# Patient Record
Sex: Female | Born: 1975 | Race: Black or African American | Hispanic: No | Marital: Single | Smoking: Never smoker
Health system: Southern US, Community
[De-identification: ages and names within clinical notes are randomized; demographics above are authoritative.]

## PROBLEM LIST (undated history)

## (undated) DIAGNOSIS — I10 Essential (primary) hypertension: Secondary | ICD-10-CM

## (undated) DIAGNOSIS — E669 Obesity, unspecified: Secondary | ICD-10-CM

## (undated) HISTORY — PX: OTHER SURGICAL HISTORY: SHX169

## (undated) HISTORY — DX: Obesity, unspecified: E66.9

---

## 2003-01-20 ENCOUNTER — Other Ambulatory Visit: Admission: RE | Admit: 2003-01-20 | Discharge: 2003-01-20 | Payer: Self-pay | Admitting: Family Medicine

## 2004-06-14 ENCOUNTER — Other Ambulatory Visit: Admission: RE | Admit: 2004-06-14 | Discharge: 2004-06-14 | Payer: Self-pay | Admitting: Family Medicine

## 2004-12-24 ENCOUNTER — Emergency Department (HOSPITAL_COMMUNITY): Admission: EM | Admit: 2004-12-24 | Discharge: 2004-12-24 | Payer: Self-pay | Admitting: Emergency Medicine

## 2005-08-05 ENCOUNTER — Other Ambulatory Visit: Admission: RE | Admit: 2005-08-05 | Discharge: 2005-08-05 | Payer: Self-pay | Admitting: Family Medicine

## 2006-12-18 ENCOUNTER — Other Ambulatory Visit: Admission: RE | Admit: 2006-12-18 | Discharge: 2006-12-18 | Payer: Self-pay | Admitting: Family Medicine

## 2007-07-04 ENCOUNTER — Encounter: Admission: RE | Admit: 2007-07-04 | Discharge: 2007-07-04 | Payer: Self-pay | Admitting: Family Medicine

## 2008-05-06 ENCOUNTER — Other Ambulatory Visit: Admission: RE | Admit: 2008-05-06 | Discharge: 2008-05-06 | Payer: Self-pay | Admitting: Family Medicine

## 2009-06-29 ENCOUNTER — Other Ambulatory Visit: Admission: RE | Admit: 2009-06-29 | Discharge: 2009-06-29 | Payer: Self-pay | Admitting: Family Medicine

## 2010-08-25 ENCOUNTER — Other Ambulatory Visit (HOSPITAL_COMMUNITY)
Admission: RE | Admit: 2010-08-25 | Discharge: 2010-08-25 | Disposition: A | Discharge status: Home / Self Care | Payer: PRIVATE HEALTH INSURANCE | Source: Ambulatory Visit | Attending: Family Medicine | Admitting: Family Medicine

## 2010-08-25 ENCOUNTER — Other Ambulatory Visit: Payer: Self-pay | Admitting: Physician Assistant

## 2010-08-25 DIAGNOSIS — Z124 Encounter for screening for malignant neoplasm of cervix: Secondary | ICD-10-CM | POA: Insufficient documentation

## 2011-12-14 ENCOUNTER — Other Ambulatory Visit: Payer: Self-pay | Admitting: Physician Assistant

## 2011-12-14 ENCOUNTER — Other Ambulatory Visit (HOSPITAL_COMMUNITY)
Admission: RE | Admit: 2011-12-14 | Discharge: 2011-12-14 | Disposition: A | Discharge status: Home / Self Care | Payer: PRIVATE HEALTH INSURANCE | Source: Ambulatory Visit | Attending: Obstetrics and Gynecology | Admitting: Obstetrics and Gynecology

## 2011-12-14 DIAGNOSIS — Z124 Encounter for screening for malignant neoplasm of cervix: Secondary | ICD-10-CM | POA: Insufficient documentation

## 2011-12-16 ENCOUNTER — Other Ambulatory Visit: Payer: Self-pay | Admitting: Family Medicine

## 2011-12-16 DIAGNOSIS — R52 Pain, unspecified: Secondary | ICD-10-CM

## 2011-12-16 DIAGNOSIS — R609 Edema, unspecified: Secondary | ICD-10-CM

## 2011-12-16 DIAGNOSIS — R531 Weakness: Secondary | ICD-10-CM

## 2011-12-21 ENCOUNTER — Other Ambulatory Visit: Payer: PRIVATE HEALTH INSURANCE

## 2011-12-22 ENCOUNTER — Ambulatory Visit
Admission: RE | Admit: 2011-12-22 | Discharge: 2011-12-22 | Disposition: A | Discharge status: Home / Self Care | Payer: PRIVATE HEALTH INSURANCE | Source: Ambulatory Visit | Attending: Family Medicine | Admitting: Family Medicine

## 2011-12-22 DIAGNOSIS — R52 Pain, unspecified: Secondary | ICD-10-CM

## 2011-12-22 DIAGNOSIS — R531 Weakness: Secondary | ICD-10-CM

## 2011-12-22 DIAGNOSIS — R609 Edema, unspecified: Secondary | ICD-10-CM

## 2012-02-21 ENCOUNTER — Encounter: Payer: PRIVATE HEALTH INSURANCE | Attending: Physician Assistant | Admitting: *Deleted

## 2012-02-21 DIAGNOSIS — E119 Type 2 diabetes mellitus without complications: Secondary | ICD-10-CM | POA: Insufficient documentation

## 2012-02-21 DIAGNOSIS — Z713 Dietary counseling and surveillance: Secondary | ICD-10-CM | POA: Insufficient documentation

## 2012-02-22 ENCOUNTER — Encounter: Payer: Self-pay | Admitting: *Deleted

## 2012-02-22 NOTE — Progress Notes (Signed)
  Patient was seen on 02/21/2012 for the first of a series of three diabetes self-management courses at the [facility].  A1c 01/2012 = 6.5% per patient The following learning objectives were met by the patient during this course:   Defines the role of glucose and insulin  Identifies type of diabetes and pathophysiology  Defines the diagnostic criteria for diabetes and prediabetes  States the risk factors for Type 2 Diabetes  States the symptoms of Type 2 Diabetes  Defines Type 2 Diabetes treatment goals  Defines Type 2 Diabetes treatment options  States the rationale for glucose monitoring  Identifies A1C, glucose targets, and testing times  Identifies proper sharps disposal  Defines the purpose of a diabetes food plan  Identifies carbohydrate food groups  Defines effects of carbohydrate foods on glucose levels  Identifies carbohydrate choices/grams/food labels  States benefits of physical activity and effect on glucose  Review of suggested activity guidelines  Handouts given during class include:  Type 2 Diabetes: Basics Book  My Food Plan Book  Food and Activity Log  Follow-Up Plan: Core Class 2

## 2012-02-22 NOTE — Patient Instructions (Signed)
Goals:  Follow Diabetes Meal Plan as instructed  Eat 3 meals and 2 snacks, every 3-5 hrs  Limit carbohydrate intake to 45-60 grams carbohydrate/meal  Limit carbohydrate intake to 0-30 grams carbohydrate/snack  Add lean protein foods to meals/snacks  Monitor glucose levels as instructed by your doctor  Aim for 5-15 mins of physical activity daily as tolerated  Bring food record and glucose log to your next nutrition visit

## 2012-03-27 ENCOUNTER — Encounter: Payer: PRIVATE HEALTH INSURANCE | Attending: Physician Assistant | Admitting: *Deleted

## 2012-03-27 DIAGNOSIS — Z713 Dietary counseling and surveillance: Secondary | ICD-10-CM | POA: Insufficient documentation

## 2012-03-27 DIAGNOSIS — E119 Type 2 diabetes mellitus without complications: Secondary | ICD-10-CM | POA: Insufficient documentation

## 2012-03-28 NOTE — Progress Notes (Signed)
  Patient was seen on 03/27/12 for the second of a series of three diabetes self-management courses at the [facility]. The following learning objectives were met by the patient during this course:   Explain basic nutrition maintenance and quality assurance  Describe causes, symptoms and treatment of hypoglycemia and hyperglycemia  Explain how to manage diabetes during illness  Describe the importance of good nutrition for health and healthy eating strategies  List strategies to follow meal plan when dining out  Describe the effects of alcohol on glucose and how to use it safely  Describe problem solving skills for day-to-day glucose challenges  Describe strategies to use when treatment plan needs to change  Identify important factors involved in successful weight loss  Describe ways to remain physically active  Describe the impact of regular activity on insulin resistance    Handouts given in class:  Refrigerator magnet for Sick Day Guidelines  [facility] Oral medication/insulin handout  Follow-Up Plan: Patient will attend the final class of the ADA Diabetes Self-Care Education.   

## 2012-03-28 NOTE — Patient Instructions (Signed)
Goals:  Follow Diabetes Meal Plan as instructed  Eat 3 meals and 2 snacks, every 3-5 hrs  Limit carbohydrate intake to 30-45 grams carbohydrate/meal  Limit carbohydrate intake to 15 grams carbohydrate/snack  Add lean protein foods to meals/snacks  Monitor glucose levels as instructed by your doctor  Aim for 30 mins of physical activity daily  Bring food record and glucose log to your next nutrition visit 

## 2012-05-08 ENCOUNTER — Encounter: Payer: PRIVATE HEALTH INSURANCE | Attending: Physician Assistant | Admitting: Dietician

## 2012-05-08 DIAGNOSIS — E119 Type 2 diabetes mellitus without complications: Secondary | ICD-10-CM

## 2012-05-08 DIAGNOSIS — Z713 Dietary counseling and surveillance: Secondary | ICD-10-CM | POA: Insufficient documentation

## 2012-05-09 NOTE — Progress Notes (Signed)
  Patient was seen on 05/08/2012 for the third of a series of three diabetes self-management courses at the [facility]. The following learning objectives were met by the patient during this course:    Describe how diabetes changes over time   Identify diabetes complications and ways to prevent them   Describe strategies that can promote heart health including lowering blood pressure and cholesterol   Describe strategies to lower dietary fat and sodium in the diet   Identify physical activities that benefit cardiovascular health   Evaluate success in meeting personal goal   Describe the belief that they can live successfully with diabetes day to day   Establish 2-3 goals that they will plan to diligently work on until they return for the free 72-month follow-up visit  The following handouts were given in class:  3 Month Follow Up Visit handout  Goal setting handout  Class evaluation form  Your patient has established the following 3 month goals for diabetes self-care:  Count Carbohydrates for most of my meals and snacks.  Be active 30 minutes or more 3 times per week.  Take my BP medication.  Initial Vitals:  HT: 5'5''  WT: 345.3 lb  A1C: 6.5% (01/2012)  Follow-Up Plan: Patient will attend a 3 month follow-up visit for diabetes self-management education.

## 2012-08-09 ENCOUNTER — Ambulatory Visit: Payer: PRIVATE HEALTH INSURANCE | Admitting: *Deleted

## 2012-12-18 ENCOUNTER — Other Ambulatory Visit: Payer: Self-pay | Admitting: Physician Assistant

## 2012-12-18 ENCOUNTER — Other Ambulatory Visit (HOSPITAL_COMMUNITY)
Admission: RE | Admit: 2012-12-18 | Discharge: 2012-12-18 | Disposition: A | Discharge status: Home / Self Care | Payer: PRIVATE HEALTH INSURANCE | Source: Ambulatory Visit | Attending: Family Medicine | Admitting: Family Medicine

## 2012-12-18 DIAGNOSIS — Z124 Encounter for screening for malignant neoplasm of cervix: Secondary | ICD-10-CM | POA: Insufficient documentation

## 2017-05-18 ENCOUNTER — Emergency Department (HOSPITAL_COMMUNITY): Payer: 59

## 2017-05-18 ENCOUNTER — Encounter (HOSPITAL_COMMUNITY): Payer: Self-pay | Admitting: Emergency Medicine

## 2017-05-18 ENCOUNTER — Other Ambulatory Visit: Payer: Self-pay

## 2017-05-18 ENCOUNTER — Inpatient Hospital Stay (HOSPITAL_COMMUNITY)
Admission: EM | Admit: 2017-05-18 | Discharge: 2017-05-20 | DRG: 418 | Disposition: A | Discharge status: Home / Self Care | Payer: 59 | Attending: Surgery | Admitting: Surgery

## 2017-05-18 DIAGNOSIS — K819 Cholecystitis, unspecified: Secondary | ICD-10-CM | POA: Diagnosis present

## 2017-05-18 DIAGNOSIS — K8000 Calculus of gallbladder with acute cholecystitis without obstruction: Secondary | ICD-10-CM | POA: Diagnosis not present

## 2017-05-18 DIAGNOSIS — I10 Essential (primary) hypertension: Secondary | ICD-10-CM | POA: Diagnosis present

## 2017-05-18 DIAGNOSIS — Z79899 Other long term (current) drug therapy: Secondary | ICD-10-CM

## 2017-05-18 DIAGNOSIS — K219 Gastro-esophageal reflux disease without esophagitis: Secondary | ICD-10-CM | POA: Diagnosis present

## 2017-05-18 DIAGNOSIS — Z6841 Body Mass Index (BMI) 40.0 and over, adult: Secondary | ICD-10-CM | POA: Diagnosis not present

## 2017-05-18 DIAGNOSIS — Z419 Encounter for procedure for purposes other than remedying health state, unspecified: Secondary | ICD-10-CM

## 2017-05-18 DIAGNOSIS — K8020 Calculus of gallbladder without cholecystitis without obstruction: Secondary | ICD-10-CM

## 2017-05-18 DIAGNOSIS — R1011 Right upper quadrant pain: Secondary | ICD-10-CM | POA: Diagnosis not present

## 2017-05-18 HISTORY — DX: Essential (primary) hypertension: I10

## 2017-05-18 LAB — I-STAT BETA HCG BLOOD, ED (MC, WL, AP ONLY): I-stat hCG, quantitative: <5 m[IU]/mL (ref <5)

## 2017-05-18 LAB — COMPREHENSIVE METABOLIC PANEL
ALT: 12 U/L — ABNORMAL LOW (ref 14–54)
AST: 17 U/L (ref 15–41)
Albumin: 3.9 g/dL (ref 3.5–5.0)
Alkaline Phosphatase: 63 U/L (ref 38–126)
Anion gap: 12 (ref 5–15)
BUN: 15 mg/dL (ref 6–20)
CO2: 25 mmol/L (ref 22–32)
Calcium: 9.3 mg/dL (ref 8.9–10.3)
Chloride: 101 mmol/L (ref 101–111)
Creatinine, Ser: 1.26 mg/dL — ABNORMAL HIGH (ref 0.44–1.00)
GFR calc Af Amer: >60 mL/min (ref >60)
GFR calc non Af Amer: 52 mL/min — ABNORMAL LOW (ref >60)
Glucose, Bld: 112 mg/dL — ABNORMAL HIGH (ref 65–99)
Potassium: 3.2 mmol/L — ABNORMAL LOW (ref 3.5–5.1)
Sodium: 138 mmol/L (ref 135–145)
Total Bilirubin: 0.5 mg/dL (ref 0.3–1.2)
Total Protein: 8.5 g/dL — ABNORMAL HIGH (ref 6.5–8.1)

## 2017-05-18 LAB — CBC
HCT: 40.4 % (ref 36.0–46.0)
Hemoglobin: 13.1 g/dL (ref 12.0–15.0)
MCH: 27.4 pg (ref 26.0–34.0)
MCHC: 32.4 g/dL (ref 30.0–36.0)
MCV: 84.5 fL (ref 78.0–100.0)
Platelets: 433 10*3/uL — ABNORMAL HIGH (ref 150–400)
RBC: 4.78 MIL/uL (ref 3.87–5.11)
RDW: 15.8 % — ABNORMAL HIGH (ref 11.5–15.5)
WBC: 11.8 10*3/uL — ABNORMAL HIGH (ref 4.0–10.5)

## 2017-05-18 LAB — LIPASE, BLOOD: Lipase: 28 U/L (ref 11–51)

## 2017-05-18 MED ORDER — POTASSIUM CHLORIDE 10 MEQ/100ML IV SOLN
10.0000 meq | INTRAVENOUS | Status: AC
  Administered 2017-05-18 – 2017-05-19 (×4): 10 meq via INTRAVENOUS
  Filled 2017-05-18 (×4): qty 100

## 2017-05-18 MED ORDER — SODIUM CHLORIDE 0.9 % IV SOLN
2.0000 g | INTRAVENOUS | Status: DC
  Administered 2017-05-19: 2 g via INTRAVENOUS
  Filled 2017-05-18: qty 20

## 2017-05-18 MED ORDER — SODIUM CHLORIDE 0.9 % IV BOLUS (SEPSIS)
1000.0000 mL | Freq: Once | INTRAVENOUS | Status: AC
  Administered 2017-05-18: 1000 mL via INTRAVENOUS

## 2017-05-18 MED ORDER — MORPHINE SULFATE (PF) 4 MG/ML IV SOLN
2.0000 mg | INTRAVENOUS | Status: DC | PRN
  Administered 2017-05-19 (×2): 2 mg via INTRAVENOUS
  Filled 2017-05-18 (×2): qty 1

## 2017-05-18 MED ORDER — CEFTRIAXONE SODIUM 2 G IJ SOLR
2.0000 g | Freq: Once | INTRAMUSCULAR | Status: AC
  Administered 2017-05-18: 2 g via INTRAVENOUS
  Filled 2017-05-18 (×2): qty 20

## 2017-05-18 MED ORDER — HYDRALAZINE HCL 20 MG/ML IJ SOLN: 20.0000 mg | INTRAMUSCULAR | Status: DC | PRN

## 2017-05-18 MED ORDER — ENOXAPARIN SODIUM 40 MG/0.4ML ~~LOC~~ SOLN
40.0000 mg | SUBCUTANEOUS | Status: DC
  Administered 2017-05-19: 40 mg via SUBCUTANEOUS
  Filled 2017-05-18: qty 0.4

## 2017-05-18 MED ORDER — ONDANSETRON 4 MG PO TBDP
4.0000 mg | ORAL_TABLET | Freq: Four times a day (QID) | ORAL | Status: DC | PRN
  Filled 2017-05-18: qty 1

## 2017-05-18 MED ORDER — ACETAMINOPHEN 650 MG RE SUPP: 650.0000 mg | Freq: Four times a day (QID) | RECTAL | Status: DC | PRN

## 2017-05-18 MED ORDER — ONDANSETRON HCL 4 MG/2ML IJ SOLN
4.0000 mg | Freq: Four times a day (QID) | INTRAMUSCULAR | Status: DC | PRN
  Administered 2017-05-19 – 2017-05-20 (×2): 4 mg via INTRAVENOUS
  Filled 2017-05-18 (×2): qty 2

## 2017-05-18 MED ORDER — PANTOPRAZOLE SODIUM 40 MG PO TBEC
40.0000 mg | DELAYED_RELEASE_TABLET | Freq: Every day | ORAL | Status: DC
  Administered 2017-05-18 – 2017-05-20 (×2): 40 mg via ORAL
  Filled 2017-05-18 (×2): qty 1

## 2017-05-18 MED ORDER — SIMETHICONE 80 MG PO CHEW
40.0000 mg | CHEWABLE_TABLET | Freq: Four times a day (QID) | ORAL | Status: DC | PRN
  Administered 2017-05-19 – 2017-05-20 (×2): 40 mg via ORAL
  Filled 2017-05-18 (×2): qty 1

## 2017-05-18 MED ORDER — SODIUM CHLORIDE 0.9 % IV SOLN
INTRAVENOUS | Status: DC
  Administered 2017-05-18: 1000 mL via INTRAVENOUS
  Administered 2017-05-19: 18:00:00 via INTRAVENOUS

## 2017-05-18 MED ORDER — ACETAMINOPHEN 325 MG PO TABS
650.0000 mg | ORAL_TABLET | Freq: Four times a day (QID) | ORAL | Status: DC | PRN
  Administered 2017-05-19: 650 mg via ORAL
  Filled 2017-05-18: qty 2

## 2017-05-18 NOTE — ED Notes (Signed)
Called for pt diet tray  

## 2017-05-18 NOTE — H&P (Signed)
Admission Note   Chief Complaint/Reason for Consult: Abdominal Pain (probable acute cholecystitis)  HPI:  Kendra Hutchinson is a 42 yo female presenting to [facility] complaining of RUQ abdominal pain. Pain started 4 days ago with acute onset sharp RUQ abdominal pain with associated nausea and vomiting. She reports that she had ~10 episodes on Monday and a few on Tuesday am. Pain is aggravated by eating, drinking, laying flat, and any movement - she is most comfortable laying with head elevated. She tried pepto, but could not keep it down. Naproxen provided minimal pain relief. Subjective chills on Monday, but did not take her temperature at home. Currently, her pain is 6/10. She denies any CP or SOB. Today, she went to an urgent care, who recommended she visit the ED for evaluation of her gallbladder.   PMH: Hypertension, GERD, obesity Surgical History: none Home medications: lisinopril, omeprazole, OTC allergy medication  SH: lives at home with her mother, father, and 2 nephews; She works as a [employer] manager, and has missed work x4 days. She is non-smoker and drinks alcohol rarely. Does not exercise. Allergies: NKDA  ROS: Review of Systems  Constitutional: Positive for chills. Negative for malaise/fatigue.  Respiratory: Negative for cough, shortness of breath and wheezing.        Cannot take deep breaths due to pain  Cardiovascular: Negative for chest pain, palpitations and leg swelling.  Gastrointestinal: Positive for abdominal pain, diarrhea, nausea and vomiting. Negative for heartburn.  Genitourinary: Negative for dysuria, frequency and urgency.       Decreased output  Skin: Negative.    No family history on file.  Vitals:   05/18/17 1641 05/18/17 1822  BP: 138/84   Pulse: 79   Resp: 20   Temp:  99.2 F (37.3 C)  SpO2: 97%    Recent Results (from the past 2160 hour(s))  Lipase, blood     Status: None   Collection Time: 05/18/17  2:17 PM  Result Value Ref Range   Lipase 28 11 - 51 U/L    Comment: Performed at [facility] [address], [place], [place] [address]  Comprehensive metabolic panel     Status: Abnormal   Collection Time: 05/18/17  2:17 PM  Result Value Ref Range   Sodium 138 135 - 145 mmol/L   Potassium 3.2 (L) 3.5 - 5.1 mmol/L   Chloride 101 101 - 111 mmol/L   CO2 25 22 - 32 mmol/L   Glucose, Bld 112 (H) 65 - 99 mg/dL   BUN 15 6 - 20 mg/dL   Creatinine, Ser 1.26 (H) 0.44 - 1.00 mg/dL   Calcium 9.3 8.9 - 10.3 mg/dL   Total Protein 8.5 (H) 6.5 - 8.1 g/dL   Albumin 3.9 3.5 - 5.0 g/dL   AST 17 15 - 41 U/L   ALT 12 (L) 14 - 54 U/L   Alkaline Phosphatase 63 38 - 126 U/L   Total Bilirubin 0.5 0.3 - 1.2 mg/dL   GFR calc non Af Amer 52 (L) >60 mL/min   GFR calc Af Amer >60 >60 mL/min    Comment: (NOTE) The eGFR has been calculated using the CKD EPI equation. This calculation has not been validated in all clinical situations. eGFR's persistently <60 mL/min signify possible Chronic Kidney Disease.    Anion gap 12 5 - 15    Comment: Performed at [facility] [address], [place], [place] [address]  CBC     Status: Abnormal  Collection Time: 05/18/17  2:17 PM  Result Value Ref Range   WBC 11.8 (H) 4.0 - 10.5 K/uL   RBC 4.78 3.87 - 5.11 MIL/uL   Hemoglobin 13.1 12.0 - 15.0 g/dL   HCT 40.4 36.0 - 46.0 %   MCV 84.5 78.0 - 100.0 fL   MCH 27.4 26.0 - 34.0 pg   MCHC 32.4 30.0 - 36.0 g/dL   RDW 15.8 (H) 11.5 - 15.5 %   Platelets 433 (H) 150 - 400 K/uL    Comment: Performed at [facility] [address], [place], [place] [address]  I-Stat beta hCG blood, ED     Status: None   Collection Time: 05/18/17  2:58 PM  Result Value Ref Range   I-stat hCG, quantitative <5.0 <5 mIU/mL   Comment 3            Comment:   GEST. AGE      CONC.  (mIU/mL)   <=1 WEEK        5 - 50     2 WEEKS       50 - 500     3 WEEKS       100 - 10,000     4 WEEKS     1,000 - 30,000        FEMALE AND NON-PREGNANT FEMALE:     LESS THAN 5  mIU/mL     Physical Exam: Physical Exam  Constitutional: She is well-developed, well-nourished, and in no distress. No distress.  HENT:  Head: Normocephalic and atraumatic.  Nose: Nose normal.  Mouth/Throat: Oropharynx is clear and moist.  Eyes: Conjunctivae are normal. Pupils are equal, round, and reactive to light. No scleral icterus.  Neck: Neck supple.  Cardiovascular: Normal rate, regular rhythm and normal heart sounds. Exam reveals no gallop and no friction rub.  No murmur heard. Pulmonary/Chest: Effort normal and breath sounds normal. No respiratory distress. She has no wheezes.  Abdominal: Soft. Bowel sounds are normal. She exhibits no distension and no mass. There is tenderness.  TTP in RUQ; positive Murphy's Sign  Lymphadenopathy:    She has no cervical adenopathy.  Skin: Skin is warm and dry. No rash noted. No erythema. No pallor.  Psychiatric: Affect normal.   Assessment/Plan Acute Cholecystitis   Plan - Admit to general surgery service. Continue IVF and control pain. Begin antibiotics.  Plan for laparoscopic cholecystectomy tomorrow.  Jenny Reichmann, PA-S [facility] Surgery 05/18/2017, 7:03 PM  She has cholecystitis on exam, will repeat US here as I do not have it.  Has murphys on exam.  Will admit, abx, npo after mn, lap chole in am. I discussed the procedure in detail. We discussed the risks and benefits of a laparoscopic cholecystectomy and possible cholangiogram including, but not limited to bleeding, infection, injury to surrounding structures such as the intestine or liver, bile leak, retained gallstones, need to convert to an open procedure, prolonged diarrhea, blood clots such as  DVT, common bile duct injury, anesthesia risks, and possible need for additional procedures.  The likelihood of improvement in symptoms and return to the patient's normal status is good. We discussed the typical post-operative recovery course.

## 2017-05-18 NOTE — ED Notes (Signed)
[facility]

## 2017-05-18 NOTE — ED Provider Notes (Signed)
Patient placed in Quick Look pathway, seen and evaluated   Chief Complaint: Right upper quadrant pain  HPI:   Ultrasound at [facility] visible on care everywhere.  Pt has right upper quadrant abdominal pain   ROS: Review of Systems  Gastrointestinal: Positive for abdominal pain, nausea and vomiting.  All other systems reviewed and are negative.   Physical Exam:   Gen: No distress  Neuro: Awake and Alert  Skin: Warm    Focused Exam: chest rrr Abdomen tender right upper quadrant   Initiation of care has begun. The patient has been counseled on the process, plan, and necessity for staying for the completion/evaluation, and the remainder of the medical screening examination   Kendra CheeksSofia, Kendra Korte K, PA-C 05/18/17 1429    Arby BarrettePfeiffer, Marcy, MD 05/19/17 [id number]

## 2017-05-18 NOTE — ED Notes (Signed)
Dr. Wakefield at bedside. 

## 2017-05-18 NOTE — ED Triage Notes (Signed)
Sent here from imaging center after gallstone visualized by ultrasound. Urgent care called patient and told her that she had an "acute gallstone blockage" and needed to be seen at the ED.

## 2017-05-18 NOTE — ED Provider Notes (Signed)
[facility] EMERGENCY DEPARTMENT Provider Note   CSN: [id number] Arrival date & time: 05/18/17  1346     History   Chief Complaint No chief complaint on file.   HPI Kendra Hutchinson is a 42 y.o. female.  HPI   42 year old female presents today with complaints of right upper quadrant abdominal pain.  Patient notes the symptoms started 4 days ago with acute onset of right upper quadrant pain, she has nausea and vomiting that has been ongoing since initial presentation.  She notes the pain was more severe on Monday but continues to persist.  Patient reports that she felt hot on Monday but did not measure temperature at home.  She notes that attempts at eating and drinking have caused worsening symptoms.  She notes that while at rest she has minimal pain, any movement causes more severe pain.  Patient was unable to tolerate any food yesterday, she was able to tolerate water today and a small amount of protein shake which significantly worsened her pain.  Patient denies any abdominal surgeries, she reports a history of hypertension but has been unable to take her blood pressure medication.  Patient notes she did have a small bowel movement this morning.    Past Medical History:  Diagnosis Date  . Hypertension   . Obesity     There are no active problems to display for this patient.   Past Surgical History:  Procedure Laterality Date  . tongue clipped       OB History    No data available       Home Medications    Prior to Admission medications   Medication Sig Start Date End Date Taking? Authorizing Provider  cyclobenzaprine (FLEXERIL) 10 MG tablet Take 10 mg by mouth 3 (three) times daily as needed.    [provider]  meloxicam (MOBIC) 7.5 MG tablet Take 7.5 mg by mouth daily as needed.    [provider]  omeprazole (PRILOSEC) 20 MG capsule Take 20 mg by mouth.    [provider]    Family History No family history on  file.  Social History Social History   Tobacco Use  . Smoking status: Never Smoker  . Smokeless tobacco: Never Used  Substance Use Topics  . Alcohol use: No    Frequency: Never  . Drug use: No     Allergies   Patient has no known allergies.   Review of Systems Review of Systems  All other systems reviewed and are negative.  Physical Exam Updated Vital Signs BP 138/84 (BP Location: Right Arm)   Pulse 79   Temp 99.2 F (37.3 C) (Oral)   Resp 20   Ht 5\' 5"  (1.651 m)   Wt (!) 150.6 kg (332 lb)   LMP 05/04/2017 (Exact Date)   SpO2 97%   BMI 55.25 kg/m   Physical Exam  Constitutional: She is oriented to person, place, and time. She appears well-developed and well-nourished.  HENT:  Head: Normocephalic and atraumatic.  Eyes: Conjunctivae are normal. Pupils are equal, round, and reactive to light. Right eye exhibits no discharge. Left eye exhibits no discharge. No scleral icterus.  Neck: Normal range of motion. No JVD present. No tracheal deviation present.  Pulmonary/Chest: Effort normal. No stridor.  Abdominal:  Tenderness palpation of right upper quadrant positive Murphy sign minor tenderness palpation of right lower quadrant, remainder of exam without acute abnormality  Neurological: She is alert and oriented to person, place, and time. Coordination  normal.  Psychiatric: She has a normal mood and affect. Her behavior is normal. Judgment and thought content normal.  Nursing note and vitals reviewed.   ED Treatments / Results  Labs (all labs ordered are listed, but only abnormal results are displayed) Labs Reviewed  COMPREHENSIVE METABOLIC PANEL - Abnormal; Notable for the following components:      Result Value   Potassium 3.2 (*)    Glucose, Bld 112 (*)    Creatinine, Ser 1.26 (*)    Total Protein 8.5 (*)    ALT 12 (*)    GFR calc non Af Amer 52 (*)    All other components within normal limits  CBC - Abnormal; Notable for the following components:   WBC  11.8 (*)    RDW 15.8 (*)    Platelets 433 (*)    All other components within normal limits  LIPASE, BLOOD  URINALYSIS, ROUTINE W REFLEX MICROSCOPIC  I-STAT BETA HCG BLOOD, ED ([facility], [facility], [facility] ONLY)    EKG  EKG Interpretation None       Radiology No results found.  Procedures Procedures (including critical care time)  Medications Ordered in ED Medications  cefTRIAXone (ROCEPHIN) 2 g in sodium chloride 0.9 % 100 mL IVPB (not administered)  sodium chloride 0.9 % bolus 1,000 mL (1,000 mLs Intravenous New Bag/Given 05/18/17 1833)     Initial Impression / Assessment and Plan / ED Course  I have reviewed the triage vital signs and the nursing notes.  Pertinent labs & imaging results that were available during my care of the patient were reviewed by me and considered in my medical decision making (see chart for details).       Final Clinical Impressions(s) / ED Diagnoses   Final diagnoses:  Calculus of gallbladder without cholecystitis without obstruction    Labs: CBC, CMP, lipase  Imaging:  Consults:  Therapeutics: Ceftriaxone, normal saline  Discharge Meds:   Assessment/Plan: 42 year old female presents today with likely symptomatic cholelithiasis with questionable early cholecystitis.  She is afebrile here with minor elevation in white count at 11.8.  No obstructive pattern on LFTs.  General surgery consulted who will see patient at bedside for likely hospital admission and ongoing management.  Patient offered pain medicine and antinausea medication she refused while here in the ED.    ED Discharge Orders    None       Rosalio LoudHedges, Kyrillos Adams, PA-C 05/18/17 1907    Vanetta MuldersZackowski, Scott, MD 05/19/17 [phone]

## 2017-05-18 NOTE — ED Notes (Signed)
Pt made aware she needs to provide a UA. States she has been unable to provide thus far, but verbalized understanding.

## 2017-05-19 ENCOUNTER — Inpatient Hospital Stay (HOSPITAL_COMMUNITY): Payer: 59

## 2017-05-19 ENCOUNTER — Encounter (HOSPITAL_COMMUNITY): Payer: Self-pay | Admitting: Certified Registered Nurse Anesthetist

## 2017-05-19 ENCOUNTER — Inpatient Hospital Stay (HOSPITAL_COMMUNITY): Payer: 59 | Admitting: Certified Registered Nurse Anesthetist

## 2017-05-19 ENCOUNTER — Other Ambulatory Visit: Payer: Self-pay

## 2017-05-19 ENCOUNTER — Encounter (HOSPITAL_COMMUNITY): Admission: EM | Disposition: A | Discharge status: Home / Self Care | Payer: Self-pay | Source: Home / Self Care

## 2017-05-19 HISTORY — PX: CHOLECYSTECTOMY: SHX55

## 2017-05-19 LAB — COMPREHENSIVE METABOLIC PANEL
ALT: 9 U/L — ABNORMAL LOW (ref 14–54)
AST: 14 U/L — AB (ref 15–41)
Albumin: 2.8 g/dL — ABNORMAL LOW (ref 3.5–5.0)
Alkaline Phosphatase: 54 U/L (ref 38–126)
Anion gap: 9 (ref 5–15)
BUN: 10 mg/dL (ref 6–20)
CO2: 24 mmol/L (ref 22–32)
Calcium: 8.2 mg/dL — ABNORMAL LOW (ref 8.9–10.3)
Chloride: 106 mmol/L (ref 101–111)
Creatinine, Ser: 0.89 mg/dL (ref 0.44–1.00)
GFR calc Af Amer: >60 mL/min (ref >60)
GFR calc non Af Amer: >60 mL/min (ref >60)
Glucose, Bld: 91 mg/dL (ref 65–99)
POTASSIUM: 3.3 mmol/L — AB (ref 3.5–5.1)
Sodium: 139 mmol/L (ref 135–145)
Total Bilirubin: 0.7 mg/dL (ref 0.3–1.2)
Total Protein: 6.1 g/dL — ABNORMAL LOW (ref 6.5–8.1)

## 2017-05-19 LAB — URINALYSIS, ROUTINE W REFLEX MICROSCOPIC
Bilirubin Urine: NEGATIVE
Glucose, UA: NEGATIVE mg/dL
Ketones, ur: NEGATIVE mg/dL
Leukocytes, UA: NEGATIVE
Nitrite: NEGATIVE
Protein, ur: NEGATIVE mg/dL
Specific Gravity, Urine: 1.005 (ref 1.005–1.030)
pH: 6 (ref 5.0–8.0)

## 2017-05-19 LAB — SURGICAL PCR SCREEN
MRSA, PCR: NEGATIVE
Staphylococcus aureus: NEGATIVE

## 2017-05-19 SURGERY — LAPAROSCOPIC CHOLECYSTECTOMY WITH INTRAOPERATIVE CHOLANGIOGRAM
Anesthesia: General | Site: Abdomen

## 2017-05-19 MED ORDER — KETOROLAC TROMETHAMINE 30 MG/ML IJ SOLN: 30.0000 mg | Freq: Once | INTRAMUSCULAR | Status: DC | PRN

## 2017-05-19 MED ORDER — ACETAMINOPHEN 325 MG PO TABS: 325.0000 mg | ORAL_TABLET | ORAL | Status: DC | PRN

## 2017-05-19 MED ORDER — ACETAMINOPHEN 160 MG/5ML PO SOLN: 325.0000 mg | ORAL | Status: DC | PRN

## 2017-05-19 MED ORDER — SUGAMMADEX SODIUM 500 MG/5ML IV SOLN
INTRAVENOUS | Status: DC | PRN
  Administered 2017-05-19: 300 mg via INTRAVENOUS

## 2017-05-19 MED ORDER — MEPERIDINE HCL 50 MG/ML IJ SOLN: 6.2500 mg | INTRAMUSCULAR | Status: DC | PRN

## 2017-05-19 MED ORDER — FENTANYL CITRATE (PF) 100 MCG/2ML IJ SOLN
25.0000 ug | INTRAMUSCULAR | Status: DC | PRN
  Administered 2017-05-19 (×2): 50 ug via INTRAVENOUS

## 2017-05-19 MED ORDER — BUPIVACAINE-EPINEPHRINE (PF) 0.25% -1:200000 IJ SOLN
INTRAMUSCULAR | Status: AC
  Filled 2017-05-19: qty 30

## 2017-05-19 MED ORDER — FENTANYL CITRATE (PF) 100 MCG/2ML IJ SOLN
INTRAMUSCULAR | Status: DC | PRN
  Administered 2017-05-19 (×5): 50 ug via INTRAVENOUS

## 2017-05-19 MED ORDER — OXYCODONE HCL 5 MG PO TABS: 5.0000 mg | ORAL_TABLET | Freq: Once | ORAL | Status: DC | PRN

## 2017-05-19 MED ORDER — MIDAZOLAM HCL 5 MG/5ML IJ SOLN
INTRAMUSCULAR | Status: DC | PRN
  Administered 2017-05-19: 2 mg via INTRAVENOUS

## 2017-05-19 MED ORDER — 0.9 % SODIUM CHLORIDE (POUR BTL) OPTIME
TOPICAL | Status: DC | PRN
  Administered 2017-05-19: 1000 mL

## 2017-05-19 MED ORDER — ONDANSETRON HCL 4 MG/2ML IJ SOLN
INTRAMUSCULAR | Status: AC
  Filled 2017-05-19: qty 2

## 2017-05-19 MED ORDER — ONDANSETRON HCL 4 MG/2ML IJ SOLN
INTRAMUSCULAR | Status: DC | PRN
  Administered 2017-05-19: 4 mg via INTRAVENOUS

## 2017-05-19 MED ORDER — DEXAMETHASONE SODIUM PHOSPHATE 10 MG/ML IJ SOLN
INTRAMUSCULAR | Status: AC
  Filled 2017-05-19: qty 1

## 2017-05-19 MED ORDER — ROCURONIUM BROMIDE 10 MG/ML (PF) SYRINGE
PREFILLED_SYRINGE | INTRAVENOUS | Status: AC
  Filled 2017-05-19: qty 5

## 2017-05-19 MED ORDER — OXYCODONE HCL 5 MG PO TABS
5.0000 mg | ORAL_TABLET | Freq: Four times a day (QID) | ORAL | Status: DC | PRN
  Administered 2017-05-19: 5 mg via ORAL
  Filled 2017-05-19: qty 1

## 2017-05-19 MED ORDER — HEMOSTATIC AGENTS (NO CHARGE) OPTIME
TOPICAL | Status: DC | PRN
  Administered 2017-05-19: 1 via TOPICAL

## 2017-05-19 MED ORDER — LACTATED RINGERS IV SOLN
INTRAVENOUS | Status: DC
  Administered 2017-05-19: 08:00:00 via INTRAVENOUS

## 2017-05-19 MED ORDER — LIDOCAINE HCL (CARDIAC) 20 MG/ML IV SOLN
INTRAVENOUS | Status: AC
  Filled 2017-05-19: qty 5

## 2017-05-19 MED ORDER — OXYCODONE HCL 5 MG/5ML PO SOLN: 5.0000 mg | Freq: Once | ORAL | Status: DC | PRN

## 2017-05-19 MED ORDER — IOPAMIDOL (ISOVUE-300) INJECTION 61%
INTRAVENOUS | Status: AC
  Filled 2017-05-19: qty 50

## 2017-05-19 MED ORDER — MIDAZOLAM HCL 2 MG/2ML IJ SOLN
INTRAMUSCULAR | Status: AC
  Filled 2017-05-19: qty 2

## 2017-05-19 MED ORDER — PROPOFOL 10 MG/ML IV BOLUS
INTRAVENOUS | Status: AC
  Filled 2017-05-19: qty 40

## 2017-05-19 MED ORDER — SODIUM CHLORIDE 0.9 % IR SOLN
Status: DC | PRN
  Administered 2017-05-19: 1000 mL

## 2017-05-19 MED ORDER — PROPOFOL 10 MG/ML IV BOLUS
INTRAVENOUS | Status: DC | PRN
  Administered 2017-05-19: 200 mg via INTRAVENOUS

## 2017-05-19 MED ORDER — IOPAMIDOL (ISOVUE-300) INJECTION 61%
INTRAVENOUS | Status: DC | PRN
  Administered 2017-05-19: 10 mL

## 2017-05-19 MED ORDER — SUCCINYLCHOLINE CHLORIDE 20 MG/ML IJ SOLN
INTRAMUSCULAR | Status: AC
  Filled 2017-05-19: qty 2

## 2017-05-19 MED ORDER — LIDOCAINE HCL (CARDIAC) 20 MG/ML IV SOLN
INTRAVENOUS | Status: DC | PRN
  Administered 2017-05-19: 100 mg via INTRAVENOUS

## 2017-05-19 MED ORDER — FENTANYL CITRATE (PF) 100 MCG/2ML IJ SOLN
INTRAMUSCULAR | Status: AC
  Filled 2017-05-19: qty 2

## 2017-05-19 MED ORDER — DEXAMETHASONE SODIUM PHOSPHATE 10 MG/ML IJ SOLN
INTRAMUSCULAR | Status: DC | PRN
  Administered 2017-05-19: 10 mg via INTRAVENOUS

## 2017-05-19 MED ORDER — BUPIVACAINE-EPINEPHRINE 0.25% -1:200000 IJ SOLN
INTRAMUSCULAR | Status: DC | PRN
  Administered 2017-05-19: 11 mL

## 2017-05-19 MED ORDER — FENTANYL CITRATE (PF) 250 MCG/5ML IJ SOLN
INTRAMUSCULAR | Status: AC
  Filled 2017-05-19: qty 5

## 2017-05-19 MED ORDER — ROCURONIUM BROMIDE 100 MG/10ML IV SOLN
INTRAVENOUS | Status: DC | PRN
  Administered 2017-05-19: 10 mg via INTRAVENOUS
  Administered 2017-05-19: 5 mg via INTRAVENOUS
  Administered 2017-05-19: 50 mg via INTRAVENOUS

## 2017-05-19 MED ORDER — FENTANYL CITRATE (PF) 100 MCG/2ML IJ SOLN: 25.0000 ug | INTRAMUSCULAR | Status: DC | PRN

## 2017-05-19 MED ORDER — STERILE WATER FOR IRRIGATION IR SOLN
Status: DC | PRN
  Administered 2017-05-19: 1000 mL

## 2017-05-19 MED ORDER — CEFAZOLIN SODIUM 1 G IJ SOLR
INTRAMUSCULAR | Status: AC
  Filled 2017-05-19: qty 40

## 2017-05-19 MED ORDER — ONDANSETRON HCL 4 MG/2ML IJ SOLN: 4.0000 mg | Freq: Once | INTRAMUSCULAR | Status: DC | PRN

## 2017-05-19 SURGICAL SUPPLY — 50 items
APPLIER CLIP ROT 10 11.4 M/L (STAPLE) ×2
BENZOIN TINCTURE PRP APPL 2/3 (GAUZE/BANDAGES/DRESSINGS) ×2 IMPLANT
CANISTER SUCT 3000ML PPV (MISCELLANEOUS) ×2 IMPLANT
CHLORAPREP W/TINT 26ML (MISCELLANEOUS) ×2 IMPLANT
CLIP APPLIE ROT 10 11.4 M/L (STAPLE) ×1 IMPLANT
COVER MAYO STAND STRL (DRAPES) ×2 IMPLANT
COVER SURGICAL LIGHT HANDLE (MISCELLANEOUS) ×2 IMPLANT
DRAPE C-ARM 42X72 X-RAY (DRAPES) ×2 IMPLANT
DRSG TEGADERM 2-3/8X2-3/4 SM (GAUZE/BANDAGES/DRESSINGS) ×6 IMPLANT
DRSG TEGADERM 4X4.75 (GAUZE/BANDAGES/DRESSINGS) ×2 IMPLANT
ELECT REM PT RETURN 9FT ADLT (ELECTROSURGICAL) ×2
ELECTRODE REM PT RTRN 9FT ADLT (ELECTROSURGICAL) ×1 IMPLANT
FILTER SMOKE EVAC LAPAROSHD (FILTER) ×2 IMPLANT
GAUZE SPONGE 2X2 8PLY STRL LF (GAUZE/BANDAGES/DRESSINGS) ×1 IMPLANT
GLOVE BIO SURGEON STRL SZ7 (GLOVE) ×2 IMPLANT
GLOVE BIO SURGEON STRL SZ7.5 (GLOVE) ×2 IMPLANT
GLOVE BIOGEL PI IND STRL 6 (GLOVE) ×2 IMPLANT
GLOVE BIOGEL PI IND STRL 7.5 (GLOVE) ×1 IMPLANT
GLOVE BIOGEL PI IND STRL 8 (GLOVE) ×1 IMPLANT
GLOVE BIOGEL PI INDICATOR 6 (GLOVE) ×2
GLOVE BIOGEL PI INDICATOR 7.5 (GLOVE) ×1
GLOVE BIOGEL PI INDICATOR 8 (GLOVE) ×1
GLOVE SURG SS PI 6.0 STRL IVOR (GLOVE) ×4 IMPLANT
GLOVE SURG SS PI 6.5 STRL IVOR (GLOVE) ×2 IMPLANT
GOWN STRL REUS W/ TWL LRG LVL3 (GOWN DISPOSABLE) ×3 IMPLANT
GOWN STRL REUS W/ TWL XL LVL3 (GOWN DISPOSABLE) ×1 IMPLANT
GOWN STRL REUS W/TWL LRG LVL3 (GOWN DISPOSABLE) ×3
GOWN STRL REUS W/TWL XL LVL3 (GOWN DISPOSABLE) ×1
HEMOSTAT SNOW SURGICEL 2X4 (HEMOSTASIS) ×2 IMPLANT
KIT BASIN OR (CUSTOM PROCEDURE TRAY) ×2 IMPLANT
KIT TURNOVER KIT B (KITS) ×2 IMPLANT
NS IRRIG 1000ML POUR BTL (IV SOLUTION) ×2 IMPLANT
PAD ARMBOARD 7.5X6 YLW CONV (MISCELLANEOUS) ×2 IMPLANT
POUCH RETRIEVAL ECOSAC 10 (ENDOMECHANICALS) ×1 IMPLANT
POUCH RETRIEVAL ECOSAC 10MM (ENDOMECHANICALS) ×1
SCISSORS LAP 5X35 DISP (ENDOMECHANICALS) ×2 IMPLANT
SET CHOLANGIOGRAPH 5 50 .035 (SET/KITS/TRAYS/PACK) ×2 IMPLANT
SET IRRIG TUBING LAPAROSCOPIC (IRRIGATION / IRRIGATOR) ×2 IMPLANT
SLEEVE ENDOPATH XCEL 5M (ENDOMECHANICALS) ×2 IMPLANT
SPECIMEN JAR SMALL (MISCELLANEOUS) ×2 IMPLANT
SPONGE GAUZE 2X2 STER 10/PKG (GAUZE/BANDAGES/DRESSINGS) ×1
STRIP CLOSURE SKIN 1/2X4 (GAUZE/BANDAGES/DRESSINGS) ×2 IMPLANT
SUT MNCRL AB 4-0 PS2 18 (SUTURE) ×2 IMPLANT
TOWEL GREEN STERILE (TOWEL DISPOSABLE) ×2 IMPLANT
TRAY LAPAROSCOPIC MC (CUSTOM PROCEDURE TRAY) ×2 IMPLANT
TROCAR XCEL BLUNT TIP 100MML (ENDOMECHANICALS) ×2 IMPLANT
TROCAR XCEL NON-BLD 11X100MML (ENDOMECHANICALS) ×2 IMPLANT
TROCAR XCEL NON-BLD 5MMX100MML (ENDOMECHANICALS) ×2 IMPLANT
TUBING INSUFFLATION (TUBING) ×2 IMPLANT
WATER STERILE IRR 1000ML POUR (IV SOLUTION) ×2 IMPLANT

## 2017-05-19 NOTE — Progress Notes (Addendum)
   Subjective/Chief Complaint: Still with some RUQ tenderness No nausea or vomiting   Objective: Vital signs in last 24 hours: Temp:  [97.7 F (36.5 C)-99.2 F (37.3 C)] 97.7 F (36.5 C) (03/15 0422) Pulse Rate:  [79-94] 80 (03/15 0422) Resp:  [17-20] 17 (03/15 0422) BP: (107-144)/(61-88) 107/61 (03/15 0422) SpO2:  [96 %-100 %] 99 % (03/15 0422) Weight:  [150.6 kg (332 lb)] 150.6 kg (332 lb) (03/14 1356) Last BM Date: 05/16/17  Intake/Output from previous day: 03/14 0701 - 03/15 0700 In: 1100 [IV Piggyback:1100] Out: 500 [Urine:500] Intake/Output this shift: No intake/output data recorded.  General appearance: alert, cooperative and no distress GI: obese, soft, tender in RUQ; no peritonitis  Lab Results:  Recent Labs    05/18/17 1417  WBC 11.8*  HGB 13.1  HCT 40.4  PLT 433*   BMET Recent Labs    05/18/17 1417  NA 138  K 3.2*  CL 101  CO2 25  GLUCOSE 112*  BUN 15  CREATININE 1.26*  CALCIUM 9.3   PT/INR No results for input(s): LABPROT, INR in the last 72 hours. ABG No results for input(s): PHART, HCO3 in the last 72 hours.  Invalid input(s): PCO2, PO2  Studies/Results: [place] Abdomen Limited Ruq  Result Date: 05/18/2017 CLINICAL DATA:  Gallstone EXAM: ULTRASOUND ABDOMEN LIMITED RIGHT UPPER QUADRANT COMPARISON:  None. FINDINGS: Gallbladder: There is an approximately 2.8 cm shadowing calculus at the neck of the gallbladder. It is uncertain whether this is impacted normal bowel. Mild gallbladder wall thickening to 3.7 mm (normal 3 mm) with positive sonographic Murphy's. Common bile duct: Diameter: 5.7 mm and within normal limits Liver: No focal lesion identified. Increased echogenicity of the liver. Portal vein is patent on color Doppler imaging with normal direction of blood flow towards the liver. IMPRESSION: 1. Cholelithiasis with positive sonographic Murphy sign and mild gallbladder wall thickening. Findings are in keeping with changes of acute  cholecystitis. 2. Fatty appearing liver. Electronically Signed   By: Tollie Ethavid  Kwon M.D.   On: 05/18/2017 20:51    Anti-infectives: Anti-infectives (From admission, onward)   Start     Dose/Rate Route Frequency Ordered Stop   05/18/17 2100  cefTRIAXone (ROCEPHIN) 2 g in sodium chloride 0.9 % 100 mL IVPB     2 g 200 mL/hr over 30 Minutes Intravenous Every 24 hours 05/18/17 2058     05/18/17 1745  cefTRIAXone (ROCEPHIN) 2 g in sodium chloride 0.9 % 100 mL IVPB     2 g 200 mL/hr over 30 Minutes Intravenous  Once 05/18/17 1735 05/18/17 2106      Assessment/Plan: Acute calculus cholecysititis  Plan laparoscopic cholecystectomy with cholangiogram today.  The surgical procedure has been discussed with the patient.  Potential risks, benefits, alternative treatments, and expected outcomes have been explained.  All of the patient's questions at this time have been answered.  The likelihood of reaching the patient's treatment goal is good.  The patient understand the proposed surgical procedure and wishes to proceed.   LOS: 1 day    Wynona LunaMatthew K Pranay Hilbun 05/19/2017

## 2017-05-19 NOTE — Op Note (Signed)
Laparoscopic Cholecystectomy with IOC Procedure Note  Indications: This patient was admitted with acute cholecystitis and will undergo laparoscopic cholecystectomy.  Pre-operative Diagnosis: Calculus of gallbladder with acute cholecystitis, without mention of obstruction  Post-operative Diagnosis: Same  Surgeon: Wynona LunaMatthew K Marquarius Lofton   Assistants: none  Anesthesia: General endotracheal anesthesia  ASA Class: 2  Procedure Details  The patient was seen again in the Holding Room. The risks, benefits, complications, treatment options, and expected outcomes were discussed with the patient. The possibilities of reaction to medication, pulmonary aspiration, perforation of viscus, bleeding, recurrent infection, finding a normal gallbladder, the need for additional procedures, failure to diagnose a condition, the possible need to convert to an open procedure, and creating a complication requiring transfusion or operation were discussed with the patient. The likelihood of improving the patient's symptoms with return to their baseline status is good.  The patient and/or family concurred with the proposed plan, giving informed consent. The site of surgery properly noted. The patient was taken to Operating Room, identified as Kendra Hutchinson and the procedure verified as Laparoscopic Cholecystectomy with Intraoperative Cholangiogram. A Time Out was held and the above information confirmed.  Prior to the induction of general anesthesia, antibiotic prophylaxis was administered. General endotracheal anesthesia was then administered and tolerated well. After the induction, the abdomen was prepped with Chloraprep and draped in the sterile fashion. The patient was positioned in the supine position.  Local anesthetic agent was injected into the skin near the umbilicus and an incision made. We dissected down to the abdominal fascia with blunt dissection.  The fascia was incised vertically and we entered the peritoneal  cavity bluntly.  A pursestring suture of 0-Vicryl was placed around the fascial opening.  The Hasson cannula was inserted and secured with the stay suture.  Pneumoperitoneum was then created with CO2 and tolerated well without any adverse changes in the patient's vital signs. An 11-mm port was placed in the subxiphoid position.  Two 5-mm ports were placed in the right upper quadrant. All skin incisions were infiltrated with a local anesthetic agent before making the incision and placing the trocars.   We positioned the patient in reverse Trendelenburg, tilted slightly to the patient's left.  The gallbladder was identified, which was very distended, thickened, and erythematous.  We decompressed the gallbladder with the suction, then the fundus was grasped and retracted cephalad. Adhesions were lysed bluntly and with the electrocautery where indicated, taking care not to injure any adjacent organs or viscus. The infundibulum was grasped and retracted laterally, exposing the peritoneum overlying the triangle of Calot. This was then divided and exposed in a blunt fashion. A critical view of the cystic duct and cystic artery was obtained.  The cystic duct was clearly identified and bluntly dissected circumferentially. The cystic duct was ligated with a clip distally.   An incision was made in the cystic duct and the [facility] chol[facility] catheter introduced. The catheter was secured using a clip. A cholangiogram was then obtained which showed good visualization of the distal and proximal biliary tree with no sign of filling defects or obstruction.  Contrast flowed easily into the duodenum. The catheter was then removed.   The cystic duct was then ligated with clips and divided. The cystic artery was identified, dissected free, ligated with clips and divided as well.   The gallbladder was dissected from the liver bed in retrograde fashion with the electrocautery. The gallbladder was removed and placed in an Endocatch  sac. The liver bed was irrigated  and inspected. Hemostasis was achieved with the electrocautery. Copious irrigation was utilized and was repeatedly aspirated until clear.  The gallbladder and Endocatch sac were then removed through the umbilical port site.  The pursestring suture was used to close the umbilical fascia.    We again inspected the right upper quadrant for hemostasis.  Pneumoperitoneum was released as we removed the trocars.  4-0 Monocryl was used to close the skin.   Benzoin, steri-strips, and clean dressings were applied. The patient was then extubated and brought to the recovery room in stable condition. Instrument, sponge, and needle counts were correct at closure and at the conclusion of the case.   Findings: Cholecystitis with Cholelithiasis  Estimated Blood Loss: Minimal         Drains: none         Specimens: Gallbladder           Complications: None; patient tolerated the procedure well.         Disposition: PACU - hemodynamically stable.         Condition: stable Wilmon Arms. Corliss Skains, MD, [facility]  General/ Trauma Surgery  05/19/2017 10:42 AM

## 2017-05-19 NOTE — Progress Notes (Signed)
Patient is going off unit now to have lap cholecystectomy. Will assess patient when she returns to the unit.

## 2017-05-19 NOTE — Addendum Note (Signed)
Addendum  created 05/19/17 1206 by Bethena Midgetddono, Ximena Todaro, MD   Order list changed, Order sets accessed

## 2017-05-19 NOTE — Anesthesia Preprocedure Evaluation (Addendum)
Anesthesia Evaluation  Patient identified by MRN, date of birth, ID band Patient awake    Reviewed: Allergy & Precautions, NPO status , Patient's Chart, lab work & pertinent test results  Airway Mallampati: II  TM Distance: >3 FB Neck ROM: Full    Dental no notable dental hx.    Pulmonary neg pulmonary ROS,    Pulmonary exam normal breath sounds clear to auscultation       Cardiovascular hypertension, negative cardio ROS Normal cardiovascular exam Rhythm:Regular Rate:Normal     Neuro/Psych negative neurological ROS  negative psych ROS   GI/Hepatic negative GI ROS, Neg liver ROS,   Endo/Other  negative endocrine ROSMorbid obesity  Renal/GU negative Renal ROS  negative genitourinary   Musculoskeletal negative musculoskeletal ROS (+)   Abdominal   Peds negative pediatric ROS (+)  Hematology negative hematology ROS (+)   Anesthesia Other Findings   Reproductive/Obstetrics negative OB ROS                            Anesthesia Physical Anesthesia Plan  ASA: III and emergent  Anesthesia Plan: General   Post-op Pain Management:    Induction: Intravenous  PONV Risk Score and Plan: 3 and Ondansetron, Dexamethasone, Treatment may vary due to age or medical condition, Midazolam and Propofol infusion  Airway Management Planned: Oral ETT  Additional Equipment:   Intra-op Plan:   Post-operative Plan: Extubation in OR  Informed Consent: I have reviewed the patients History and Physical, chart, labs and discussed the procedure including the risks, benefits and alternatives for the proposed anesthesia with the patient or authorized representative who has indicated his/her understanding and acceptance.     Plan Discussed with: CRNA, Anesthesiologist and Surgeon  Anesthesia Plan Comments: (  )      Anesthesia Quick Evaluation

## 2017-05-19 NOTE — Transfer of Care (Signed)
Immediate Anesthesia Transfer of Care Note  Patient: Kendra Hutchinson  Procedure(s) Performed: LAPAROSCOPIC CHOLECYSTECTOMY WITH INTRAOPERATIVE CHOLANGIOGRAM (N/A Abdomen)  Patient Location: PACU  Anesthesia Type:General  Level of Consciousness: awake, alert  and oriented  Airway & Oxygen Therapy: Patient Spontanous Breathing and Patient connected to nasal cannula oxygen  Post-op Assessment: Report given to RN and Post -op Vital signs reviewed and stable  Post vital signs: Reviewed and stable  Last Vitals:  Vitals:   05/19/17 0422 05/19/17 1049  BP: 107/61   Pulse: 80   Resp: 17   Temp: 36.5 C (P) 37.1 C  SpO2: 99%     Last Pain:  Vitals:   05/19/17 1049  TempSrc:   PainSc: (P) 0-No pain      Patients Stated Pain Goal: 0 (05/19/17 0126)  Complications: No apparent anesthesia complications

## 2017-05-19 NOTE — Anesthesia Procedure Notes (Signed)
Procedure Name: Intubation Date/Time: 05/19/2017 9:16 AM Performed by: Candis Shine, CRNA Pre-anesthesia Checklist: Patient identified, Emergency Drugs available, Suction available and Patient being monitored Patient Re-evaluated:Patient Re-evaluated prior to induction Oxygen Delivery Method: Circle System Utilized Preoxygenation: Pre-oxygenation with 100% oxygen Induction Type: IV induction Ventilation: Mask ventilation without difficulty Laryngoscope Size: Mac and 3 Grade View: Grade I Tube type: Oral Tube size: 7.0 mm Number of attempts: 1 Airway Equipment and Method: Stylet Placement Confirmation: ETT inserted through vocal cords under direct vision,  positive ETCO2 and breath sounds checked- equal and bilateral Secured at: 22 cm Tube secured with: Tape Dental Injury: Teeth and Oropharynx as per pre-operative assessment

## 2017-05-19 NOTE — Anesthesia Postprocedure Evaluation (Signed)
Anesthesia Post Note  Patient: Kendra Hutchinson  Procedure(s) Performed: LAPAROSCOPIC CHOLECYSTECTOMY WITH INTRAOPERATIVE CHOLANGIOGRAM (N/A Abdomen)     Patient location during evaluation: PACU Anesthesia Type: General Level of consciousness: awake and alert Pain management: pain level controlled Vital Signs Assessment: post-procedure vital signs reviewed and stable Respiratory status: spontaneous breathing, nonlabored ventilation, respiratory function stable and patient connected to nasal cannula oxygen Cardiovascular status: blood pressure returned to baseline and stable Postop Assessment: no apparent nausea or vomiting Anesthetic complications: no    Last Vitals:  Vitals:   05/19/17 1049 05/19/17 1100  BP:    Pulse: 100 95  Resp: 14 14  Temp: 37.1 C   SpO2: 99% 100%    Last Pain:  Vitals:   05/19/17 1049  TempSrc:   PainSc: 0-No pain                 Nikolas Casher

## 2017-05-19 NOTE — Progress Notes (Signed)
Patient to Short-Stay for surgery.

## 2017-05-19 NOTE — Discharge Instructions (Signed)
Please arrive at least 30 min before your appointment to complete your check in paperwork.  If you are unable to arrive 30 min prior to your appointment time we may have to cancel or reschedule you. ° °LAPAROSCOPIC SURGERY: POST OP INSTRUCTIONS  °1. DIET: Follow a light bland diet the first 24 hours after arrival home, such as soup, liquids, crackers, etc. Be sure to include lots of fluids daily. Avoid fast food or heavy meals as your are more likely to get nauseated. Eat a low fat the next few days after surgery.  °2. Take your usually prescribed home medications unless otherwise directed. °3. PAIN CONTROL:  °1. Pain is best controlled by a usual combination of three different methods TOGETHER:  °1. Ice/Heat °2. Over the counter pain medication °3. Prescription pain medication °2. Most patients will experience some swelling and bruising around the incisions. Ice packs or heating pads (30-60 minutes up to 6 times a day) will help. Use ice for the first few days to help decrease swelling and bruising, then switch to heat to help relax tight/sore spots and speed recovery. Some people prefer to use ice alone, heat alone, alternating between ice & heat. Experiment to what works for you. Swelling and bruising can take several weeks to resolve.  °3. It is helpful to take an over-the-counter pain medication regularly for the first few weeks. Choose one of the following that works best for you:  °1. Naproxen (Aleve, etc) Two 220mg tabs twice a day °2. Ibuprofen (Advil, etc) Three 200mg tabs four times a day (every meal & bedtime) °3. Acetaminophen (Tylenol, etc) 500-650mg four times a day (every meal & bedtime) °4. A prescription for pain medication (such as oxycodone, hydrocodone, etc) should be given to you upon discharge. Take your pain medication as prescribed.  °1. If you are having problems/concerns with the prescription medicine (does not control pain, nausea, vomiting, rash, itching, etc), please call us [phone]  [phone] to see if we need to switch you to a different pain medicine that will work better for you and/or control your side effect better. °2. If you need a refill on your pain medication, please contact your pharmacy. They will contact our office to request authorization. Prescriptions will not be filled after 5 pm or on week-ends. °4. Avoid getting constipated. Between the surgery and the pain medications, it is common to experience some constipation. Increasing fluid intake and taking a fiber supplement (such as Metamucil, Citrucel, FiberCon, MiraLax, etc) 1-2 times a day regularly will usually help prevent this problem from occurring. A mild laxative (prune juice, Milk of Magnesia, MiraLax, etc) should be taken according to package directions if there are no bowel movements after 48 hours.  °5. Watch out for diarrhea. If you have many loose bowel movements, simplify your diet to bland foods & liquids for a few days. Stop any stool softeners and decrease your fiber supplement. Switching to mild anti-diarrheal medications (Kayopectate, Pepto Bismol) can help. If this worsens or does not improve, please call us. °6. Wash / shower every day. You may shower over the dressings as they are waterproof. Continue to shower over incision(s) after the dressing is off. °7. Remove your waterproof bandages 5 days after surgery. You may leave the incision open to air. You may replace a dressing/Band-Aid to cover the incision for comfort if you wish.  °8. ACTIVITIES as tolerated:  °1. You may resume regular (light) daily activities beginning the next day--such as daily self-care, walking, climbing stairs--gradually   increasing activities as tolerated. If you can walk 30 minutes without difficulty, it is safe to try more intense activity such as jogging, treadmill, bicycling, low-impact aerobics, swimming, etc. °2. Save the most intensive and strenuous activity for last such as sit-ups, heavy lifting, contact sports, etc Refrain  from any heavy lifting or straining until you are off narcotics for pain control.  °3. DO NOT PUSH THROUGH PAIN. Let pain be your guide: If it hurts to do something, don't do it. Pain is your body warning you to avoid that activity for another week until the pain goes down. °4. You may drive when you are no longer taking prescription pain medication, you can comfortably wear a seatbelt, and you can safely maneuver your car and apply brakes. °5. You may have sexual intercourse when it is comfortable.  °9. FOLLOW UP in our office  °1. Please call [facility] at [phone] to set up an appointment to see your surgeon in the office for a follow-up appointment approximately 2-3 weeks after your surgery. °2. Make sure that you call for this appointment the day you arrive home to insure a convenient appointment time. °     10. IF YOU HAVE DISABILITY OR FAMILY LEAVE FORMS, BRING THEM TO THE               OFFICE FOR PROCESSING.  ° °WHEN TO CALL US [phone]:  °1. Poor pain control °2. Reactions / problems with new medications (rash/itching, nausea, etc)  °3. Fever over 101.5 F (38.5 C) °4. Inability to urinate °5. Nausea and/or vomiting °6. Worsening swelling or bruising °7. Continued bleeding from incision. °8. Increased pain, redness, or drainage from the incision ° °The clinic staff is available to answer your questions during regular business hours (8:30am-5pm). Please don’t hesitate to call and ask to speak to one of our nurses for clinical concerns.  °If you have a medical emergency, go to the nearest emergency room or call 911.  °A surgeon from [facility] is always on call at the hospitals  ° °[facility]  °[address], [address], [place], [place] [address] ?  °MAIN: [phone] ? TOLL FREE: [phone] ?  °FAX [phone]  °[email or web address] ° °

## 2017-05-20 ENCOUNTER — Encounter (HOSPITAL_COMMUNITY): Payer: Self-pay | Admitting: Surgery

## 2017-05-20 MED ORDER — SIMETHICONE 80 MG PO CHEW
80.0000 mg | CHEWABLE_TABLET | Freq: Once | ORAL | Status: AC
  Administered 2017-05-20: 80 mg via ORAL
  Filled 2017-05-20: qty 1

## 2017-05-20 MED ORDER — OXYCODONE HCL 5 MG PO TABS: 5.0000 mg | ORAL_TABLET | Freq: Four times a day (QID) | ORAL | 0 refills | Status: DC | PRN

## 2017-05-20 NOTE — Discharge Summary (Signed)
     Patient ID: Kendra Hutchinson [id number] 04-Apr-1975 42 y.o.  Admit date: 05/18/2017 Discharge date: 05/20/2017  Admitting Diagnosis: cholecystitis  Discharge Diagnosis Patient Active Problem List   Diagnosis Date Noted  . Cholecystitis 05/18/2017    Consultants none  Reason for Admission: Kendra KellsSharron Hutchinson is a 42 yo female [facility] complaining of RUQ abdominal pain. Pain started 4 days ago with acute onset sharp RUQ abdominal pain with associated nausea and vomiting. She reports that she had ~10 episodes on Monday and a few on Tuesday am. Pain is aggravated by eating, drinking, laying flat, and any movement - she is most comfortable laying with head elevated. She tried pepto, but could not keep it down. Naproxen provided minimal pain relief. Subjective chills on Monday, but did not take her temperature at home. Currently, her pain is 6/10. She denies any CP or SOB. Today, she went to an urgent care, who recommended she visit the ED for evaluation of her gallbladder.   Pro[facility]  Hospital Course:  The patient was admitted and underwent a laparoscopic cholecystectomy with IOC.  The patient tolerated the procedure well.  On POD 1, the patient was tolerating a regular diet, voiding well, mobilizing, and pain was controlled with oral pain medications.  The patient was stable for DC home at this time with appropriate follow up made.  [place] Controlled substance website has been checked prior to prescribing narcotics for home use.    Physical Exam: Heart: regular Lungs: CTAB Abs: soft, morbidly obese, all 4 incisions are c/d/i with gauze and tegaderms in place.  No drainage.  Minimally tender.  Allergies as of 05/20/2017   No Known Allergies     Medication List    TAKE these medications   CLARINEX-D 24 HOUR PO Take 1 tablet by mouth as needed.   cyclobenzaprine 10 MG tablet Commonly known as:  FLEXERIL Take 10 mg by mouth 3  (three) times daily as needed.   lisinopril 10 MG tablet Commonly known as:  PRINIVIL,ZESTRIL Take 10 mg by mouth daily.   omeprazole 20 MG capsule Commonly known as:  PRILOSEC Take 20 mg by mouth.   oxyCODONE 5 MG immediate release tablet Commonly known as:  Oxy IR/ROXICODONE Take 1 tablet (5 mg total) by mouth every 6 (six) hours as needed for moderate pain.        Follow-up Information    [facility] on 05/30/2017.   Specialty:  General Surgery Why:  Your appointment is at 11:15 AM. Please arrive 30 min prior to appointment time. Bring photo ID and insurance information.  Contact information: [address] [place] [address] [phone]           Signed: Barnetta ChapelKelly Malayshia All, [facility] 05/20/2017, 8:41 AM Pager: [phone] Consults: [phone] Mon-Fri 7:00 am-4:30 pm Sat-Sun 7:00 am-11:30 am

## 2017-06-21 ENCOUNTER — Other Ambulatory Visit (HOSPITAL_COMMUNITY)
Admission: RE | Admit: 2017-06-21 | Discharge: 2017-06-21 | Disposition: A | Discharge status: Home / Self Care | Payer: 59 | Source: Ambulatory Visit | Attending: Obstetrics and Gynecology | Admitting: Obstetrics and Gynecology

## 2017-06-21 ENCOUNTER — Other Ambulatory Visit: Payer: Self-pay | Admitting: Obstetrics and Gynecology

## 2017-06-21 DIAGNOSIS — Z01411 Encounter for gynecological examination (general) (routine) with abnormal findings: Secondary | ICD-10-CM | POA: Diagnosis present

## 2017-06-23 LAB — CYTOLOGY - PAP
Diagnosis: NEGATIVE
HPV (WINDOPATH): NOT DETECTED

## 2017-08-10 ENCOUNTER — Other Ambulatory Visit: Payer: Self-pay | Admitting: Obstetrics and Gynecology

## 2018-08-07 ENCOUNTER — Other Ambulatory Visit: Payer: Self-pay | Admitting: Obstetrics and Gynecology

## 2018-08-07 DIAGNOSIS — Z1231 Encounter for screening mammogram for malignant neoplasm of breast: Secondary | ICD-10-CM

## 2018-09-25 ENCOUNTER — Other Ambulatory Visit: Payer: Self-pay

## 2018-09-25 ENCOUNTER — Ambulatory Visit
Admission: RE | Admit: 2018-09-25 | Discharge: 2018-09-25 | Disposition: A | Discharge status: Home / Self Care | Payer: 59 | Source: Ambulatory Visit | Attending: Obstetrics and Gynecology | Admitting: Obstetrics and Gynecology

## 2018-09-25 DIAGNOSIS — Z1231 Encounter for screening mammogram for malignant neoplasm of breast: Secondary | ICD-10-CM

## 2018-09-26 ENCOUNTER — Other Ambulatory Visit: Payer: Self-pay | Admitting: Obstetrics and Gynecology

## 2018-09-26 DIAGNOSIS — R928 Other abnormal and inconclusive findings on diagnostic imaging of breast: Secondary | ICD-10-CM

## 2018-09-27 ENCOUNTER — Ambulatory Visit
Admission: RE | Admit: 2018-09-27 | Discharge: 2018-09-27 | Disposition: A | Discharge status: Home / Self Care | Payer: 59 | Source: Ambulatory Visit | Attending: Obstetrics and Gynecology | Admitting: Obstetrics and Gynecology

## 2018-09-27 DIAGNOSIS — R928 Other abnormal and inconclusive findings on diagnostic imaging of breast: Secondary | ICD-10-CM

## 2019-08-27 ENCOUNTER — Other Ambulatory Visit: Payer: Self-pay | Admitting: Primary Care

## 2019-08-27 ENCOUNTER — Other Ambulatory Visit: Payer: Self-pay | Admitting: Obstetrics and Gynecology

## 2019-08-27 DIAGNOSIS — Z1231 Encounter for screening mammogram for malignant neoplasm of breast: Secondary | ICD-10-CM

## 2019-09-27 ENCOUNTER — Other Ambulatory Visit: Payer: Self-pay

## 2019-09-27 ENCOUNTER — Ambulatory Visit
Admission: RE | Admit: 2019-09-27 | Discharge: 2019-09-27 | Disposition: A | Discharge status: Home / Self Care | Payer: 59 | Source: Ambulatory Visit

## 2019-09-27 DIAGNOSIS — Z1231 Encounter for screening mammogram for malignant neoplasm of breast: Secondary | ICD-10-CM

## 2020-09-28 ENCOUNTER — Other Ambulatory Visit: Payer: Self-pay | Admitting: Obstetrics and Gynecology

## 2020-09-28 DIAGNOSIS — Z1231 Encounter for screening mammogram for malignant neoplasm of breast: Secondary | ICD-10-CM

## 2020-09-30 ENCOUNTER — Other Ambulatory Visit: Payer: Self-pay

## 2020-09-30 ENCOUNTER — Ambulatory Visit
Admission: RE | Admit: 2020-09-30 | Discharge: 2020-09-30 | Disposition: A | Discharge status: Home / Self Care | Payer: 59 | Source: Ambulatory Visit

## 2020-09-30 DIAGNOSIS — Z1231 Encounter for screening mammogram for malignant neoplasm of breast: Secondary | ICD-10-CM

## 2021-07-13 ENCOUNTER — Other Ambulatory Visit: Payer: Self-pay | Admitting: Gastroenterology

## 2021-08-24 ENCOUNTER — Other Ambulatory Visit: Payer: Self-pay | Admitting: Obstetrics and Gynecology

## 2021-08-24 ENCOUNTER — Other Ambulatory Visit: Payer: Self-pay | Admitting: Orthopedic Surgery

## 2021-08-24 DIAGNOSIS — S46001A Unspecified injury of muscle(s) and tendon(s) of the rotator cuff of right shoulder, initial encounter: Secondary | ICD-10-CM

## 2021-08-24 DIAGNOSIS — Z1231 Encounter for screening mammogram for malignant neoplasm of breast: Secondary | ICD-10-CM

## 2021-08-29 ENCOUNTER — Other Ambulatory Visit: Payer: Managed Care, Other (non HMO)

## 2021-09-14 ENCOUNTER — Ambulatory Visit
Admission: RE | Admit: 2021-09-14 | Discharge: 2021-09-14 | Disposition: A | Discharge status: Home / Self Care | Payer: Managed Care, Other (non HMO) | Source: Ambulatory Visit | Attending: Orthopedic Surgery | Admitting: Orthopedic Surgery

## 2021-09-14 DIAGNOSIS — S46001A Unspecified injury of muscle(s) and tendon(s) of the rotator cuff of right shoulder, initial encounter: Secondary | ICD-10-CM

## 2021-09-27 DIAGNOSIS — Z1231 Encounter for screening mammogram for malignant neoplasm of breast: Secondary | ICD-10-CM

## 2021-10-05 ENCOUNTER — Ambulatory Visit
Admission: RE | Admit: 2021-10-05 | Discharge: 2021-10-05 | Disposition: A | Discharge status: Home / Self Care | Payer: Managed Care, Other (non HMO) | Source: Ambulatory Visit

## 2021-10-05 DIAGNOSIS — Z1231 Encounter for screening mammogram for malignant neoplasm of breast: Secondary | ICD-10-CM

## 2021-11-09 ENCOUNTER — Encounter (HOSPITAL_COMMUNITY): Payer: Self-pay | Admitting: Gastroenterology

## 2021-11-15 NOTE — Anesthesia Preprocedure Evaluation (Signed)
Anesthesia Evaluation  Patient identified by MRN, date of birth, ID band Patient awake    Reviewed: Allergy & Precautions, NPO status , Patient's Chart, lab work & pertinent test results  History of Anesthesia Complications Negative for: history of anesthetic complications  Airway Mallampati: II  TM Distance: >3 FB Neck ROM: Full    Dental no notable dental hx.    Pulmonary neg pulmonary ROS,    Pulmonary exam normal        Cardiovascular hypertension, Pt. on medications Normal cardiovascular exam     Neuro/Psych negative neurological ROS  negative psych ROS   GI/Hepatic Neg liver ROS, GERD  Medicated and Controlled,  Endo/Other  diabetes, Type 2Morbid obesity (BMI 55)  Renal/GU negative Renal ROS  negative genitourinary   Musculoskeletal negative musculoskeletal ROS (+)   Abdominal   Peds  Hematology negative hematology ROS (+)   Anesthesia Other Findings Day of surgery medications reviewed with patient.  Reproductive/Obstetrics negative OB ROS                            Anesthesia Physical Anesthesia Plan  ASA: 3  Anesthesia Plan: MAC   Post-op Pain Management: Minimal or no pain anticipated   Induction:   PONV Risk Score and Plan: Treatment may vary due to age or medical condition and Propofol infusion  Airway Management Planned: Natural Airway and Nasal Cannula  Additional Equipment: None  Intra-op Plan:   Post-operative Plan:   Informed Consent: I have reviewed the patients History and Physical, chart, labs and discussed the procedure including the risks, benefits and alternatives for the proposed anesthesia with the patient or authorized representative who has indicated his/her understanding and acceptance.       Plan Discussed with: CRNA  Anesthesia Plan Comments:        Anesthesia Quick Evaluation

## 2021-11-15 NOTE — H&P (Signed)
History of Present Illness  General:  Patient is a 46 year old female, new patient, who presents to set up screening colonoscopy. History of HTN, depreesion/anxiety, GERD, menorrhagia, T2DM. Patient states before cholecystectomy she would have a bowel movement at least twice a day. Since surgery she is only having BMs twice a week, with straining, and taking stool softeners, fiber, and probiotics. Denies pain with bowel movements.  Denies nausea, vomiting, fever, chills, abdominal pain, dysphagia. Reflux well controlled on once daily omeprazole and diet changes. Denies diarrhea, melena, hematochezia, unintentional weight loss.  Denies family history of GI malignancy or disease. Denies MI/stroke in the last 6 months, not on blood thinners. History of diabetes but states she is not on medications or insulin at this time.   Current Medications  Taking   Norethindrone 0.35 MG Tablet 1 tablet Orally Once a day  Lisinopril 10 MG Tablet 1 tablet Orally Once a day for BP and kidney protection  Flexeril(Cyclobenzaprine HCl) 10 MG Tablet 1 tablet Orally as needed, Notes: for knee and ankle pain  One Touch Ultra II test strips strips Use to check the patient's CBG q AM, alternating between AM and PM meals, AC dx 250  OneTouch Delica Lancets Miscellaneous as directed dx 250  Omeprazole 20 MG Capsule Delayed Release 1 capsule 30 minutes before morning meal Orally Once a day  traZODone HCl 50 MG Tablet 1 tablet at bedtime as needed Orally Once a day  Not-Taking   Lysteda(Tranexamic Acid) 650 MG Tablet 2 tablets Orally TID up to 5 days with menses  Cytotec(miSOPROStol) 200 MCG Tablet 1 tablet vaginally qhs night before procedure and am of procedure  Mobic(Meloxicam) 15 MG Tablet 1 tablet Orally as needed, Notes: for knee and ankle pain  Medication List reviewed and reconciled with the patient   Past Medical History  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   HTN (dx 08/2010).   depression/ anxiety ([place]).    Abn PAP .   Occasional irregular heart rate dx'd in high school.   Acid reflux.   Menorrhagia.   Prediabetes.    Surgical History  Tongue clipped as an infant   cholecystectomy 05/19/2017   Family History  Father: alive, MI at 9 due to A. fib, diagnosed with Diabetes, Hypertension, Coronary artery disease  Mother: alive, diagnosed with Hypertension  Maternal Grand Mother: thyroid disorder, ovarian cancer, diagnosed with Ovarian cancer  Sister 1: HTN  1 brother(s) , 1 sister(s) - healthy.   MGM had Ovarian cancer.   Social History  General:  Tobacco use  cigarettes: Never smoked Tobacco history last updated 07/13/2021 Vaping No no EXPOSURE TO PASSIVE SMOKE.  Alcohol: yes, 1 or less.  no Recreational drug use.  Exercise: yes.  Marital Status: single.  Children: none.  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  no Travel outside [place].    Allergies  Celebrex: swelling - Allergy   Hospitalization/Major Diagnostic Procedure  None 07/2021   Review of Systems  GI PROCEDURE:  Pacemaker/ AICD no. Artificial heart valves no. MI/heart attack no. Abnormal heart rhythm no. Angina no. CVA no. Hypertension YES. Hypotension no. Asthma, COPD no. Sleep apnea no. Seizure disorders no. Artificial joints no. Diabetes no. Significant headaches no. Vertigo no. Depression/anxiety YES. Abnormal bleeding no. Kidney Disease no. Liver disease no. Blood transfusion no.     Vital Signs  Wt 331.6, Wt change -11.7 lbs, Ht 65, BMI 55.18, Temp 97, Pulse sitting 89, BP sitting 113/72.   Examination  Gastroenterology Exam: GENERAL APPEARANCE:  Pleasant, obese female in no acute distress.  RESPIRATORY Breath sounds normal. Respiration even and unlabored.  CARDIOVASCULAR RRR w/o murmurs or gallops. No peripheral edema.  ABDOMEN Soft, nontender. No masses palpated. Liver and spleen not palpated, normal. Bowel sounds normal, Abdomen not distended.  EXTREMITIES: No edema, pulses  intact.  SKIN Warm and dry, good turgor without rashes.  PSYCHIATRIC Alert and oriented x3, mood and affect appear normal..  NEURO: alert, normal strength and tone.     Assessments     1. Screen for colon cancer - Z12.11 (Primary)   2. Constipation, unspecified constipation type - K59.00   Treatment  1. Screen for colon cancer  IMAGING: Colonoscopy    Andria Meuse 07/13/2021 08:58:38 AM > Scheduled at [facility] on 11/16/2021 8:30- trilyte- propofol- discussed weight loss nad BMI being under 50 and if she gets there before 9/12 she will call us back to move to [facility].   Clinical Notes: Patient is due for repeat screening colonoscopy. I thoroughly discussed the procedure with the patient including but not limited to nature, alternatives, benefits, and risks (including but not limited to bleeding, infection, perforation, anesthesia/cardiopulmonary complications). All questions were answered and the patient acknowledges these risks and wishes to proceed with colonoscopy.  Patient will need hospital procedure due to BMI over 50.    2. Constipation, unspecified constipation type  Clinical Notes: Patient having bowel movements twice a week with straining. Already taking fiber, stool softeners, and probiotics. Recommend addition of MiraLAX.

## 2021-11-16 ENCOUNTER — Ambulatory Visit (HOSPITAL_COMMUNITY)
Admission: RE | Admit: 2021-11-16 | Discharge: 2021-11-16 | Disposition: A | Discharge status: Home / Self Care | Payer: Managed Care, Other (non HMO) | Source: Ambulatory Visit | Attending: Gastroenterology | Admitting: Gastroenterology

## 2021-11-16 ENCOUNTER — Ambulatory Visit (HOSPITAL_BASED_OUTPATIENT_CLINIC_OR_DEPARTMENT_OTHER): Payer: Managed Care, Other (non HMO) | Admitting: Anesthesiology

## 2021-11-16 ENCOUNTER — Encounter (HOSPITAL_COMMUNITY): Payer: Self-pay | Admitting: Gastroenterology

## 2021-11-16 ENCOUNTER — Encounter (HOSPITAL_COMMUNITY)
Admission: RE | Disposition: A | Discharge status: Home / Self Care | Payer: Self-pay | Source: Ambulatory Visit | Attending: Gastroenterology

## 2021-11-16 ENCOUNTER — Ambulatory Visit (HOSPITAL_COMMUNITY): Payer: Managed Care, Other (non HMO) | Admitting: Anesthesiology

## 2021-11-16 ENCOUNTER — Other Ambulatory Visit: Payer: Self-pay

## 2021-11-16 DIAGNOSIS — K5730 Diverticulosis of large intestine without perforation or abscess without bleeding: Secondary | ICD-10-CM | POA: Insufficient documentation

## 2021-11-16 DIAGNOSIS — Z1211 Encounter for screening for malignant neoplasm of colon: Secondary | ICD-10-CM | POA: Insufficient documentation

## 2021-11-16 DIAGNOSIS — E119 Type 2 diabetes mellitus without complications: Secondary | ICD-10-CM | POA: Diagnosis not present

## 2021-11-16 DIAGNOSIS — I10 Essential (primary) hypertension: Secondary | ICD-10-CM

## 2021-11-16 DIAGNOSIS — Z9049 Acquired absence of other specified parts of digestive tract: Secondary | ICD-10-CM | POA: Diagnosis not present

## 2021-11-16 DIAGNOSIS — K219 Gastro-esophageal reflux disease without esophagitis: Secondary | ICD-10-CM | POA: Diagnosis not present

## 2021-11-16 DIAGNOSIS — F418 Other specified anxiety disorders: Secondary | ICD-10-CM | POA: Diagnosis not present

## 2021-11-16 DIAGNOSIS — Z79899 Other long term (current) drug therapy: Secondary | ICD-10-CM | POA: Diagnosis not present

## 2021-11-16 DIAGNOSIS — K648 Other hemorrhoids: Secondary | ICD-10-CM

## 2021-11-16 DIAGNOSIS — K5900 Constipation, unspecified: Secondary | ICD-10-CM | POA: Diagnosis not present

## 2021-11-16 DIAGNOSIS — K635 Polyp of colon: Secondary | ICD-10-CM | POA: Diagnosis not present

## 2021-11-16 DIAGNOSIS — Z6841 Body Mass Index (BMI) 40.0 and over, adult: Secondary | ICD-10-CM | POA: Diagnosis not present

## 2021-11-16 HISTORY — PX: COLONOSCOPY WITH PROPOFOL: SHX5780

## 2021-11-16 HISTORY — PX: POLYPECTOMY: SHX5525

## 2021-11-16 SURGERY — COLONOSCOPY WITH PROPOFOL
Anesthesia: Monitor Anesthesia Care

## 2021-11-16 MED ORDER — LIDOCAINE 2% (20 MG/ML) 5 ML SYRINGE
INTRAMUSCULAR | Status: DC | PRN
  Administered 2021-11-16: 80 mg via INTRAVENOUS

## 2021-11-16 MED ORDER — PROPOFOL 10 MG/ML IV BOLUS
INTRAVENOUS | Status: DC | PRN
  Administered 2021-11-16: 20 mg via INTRAVENOUS
  Administered 2021-11-16: 30 mg via INTRAVENOUS
  Administered 2021-11-16: 20 mg via INTRAVENOUS

## 2021-11-16 MED ORDER — LACTATED RINGERS IV SOLN
INTRAVENOUS | Status: DC
  Administered 2021-11-16: 1000 mL via INTRAVENOUS

## 2021-11-16 MED ORDER — PROPOFOL 500 MG/50ML IV EMUL
INTRAVENOUS | Status: AC
  Filled 2021-11-16: qty 50

## 2021-11-16 MED ORDER — PROPOFOL 500 MG/50ML IV EMUL
INTRAVENOUS | Status: DC | PRN
  Administered 2021-11-16: 125 ug/kg/min via INTRAVENOUS

## 2021-11-16 MED ORDER — SODIUM CHLORIDE 0.9 % IV SOLN: INTRAVENOUS | Status: DC

## 2021-11-16 SURGICAL SUPPLY — 22 items

## 2021-11-16 NOTE — Op Note (Addendum)
During colonoscopy, the suction channel stopped working. A brush was used to clean the scope and a retained endoclip with small amount of retained tissue was pushed out of the scope. It was immediately removed using a net. This was relayed to the patient in recovery post procedure and also discussed with her mother who was present at bedside. They verbalized understanding.

## 2021-11-16 NOTE — Interval H&P Note (Signed)
History and Physical Interval Note: 46/female with morbid obesity for colonoscopy for screening.   11/16/2021 7:37 AM  Kendra Hutchinson Peper  has presented today for colonoscopy, with the diagnosis of screening.  The various methods of treatment have been discussed with the patient and family. After consideration of risks, benefits and other options for treatment, the patient has consented to  Procedure(s): COLONOSCOPY WITH PROPOFOL (N/A) as a surgical intervention.  The patient's history has been reviewed, patient examined, no change in status, stable for surgery.  I have reviewed the patient's chart and labs.  Questions were answered to the patient's satisfaction.     Kerin Salen

## 2021-11-16 NOTE — Transfer of Care (Signed)
Immediate Anesthesia Transfer of Care Note  Patient: Kendra Hutchinson  Procedure(s) Performed: COLONOSCOPY WITH PROPOFOL POLYPECTOMY  Patient Location: PACU  Anesthesia Type:MAC  Level of Consciousness: awake, alert  and oriented  Airway & Oxygen Therapy: Patient Spontanous Breathing and Patient connected to face mask oxygen  Post-op Assessment: Report given to RN and Post -op Vital signs reviewed and stable  Post vital signs: Reviewed and stable  Last Vitals:  Vitals Value Taken Time  BP    Temp    Pulse    Resp    SpO2      Last Pain:  Vitals:   11/16/21 0731  TempSrc: Temporal  PainSc: 0-No pain         Complications: No notable events documented.

## 2021-11-16 NOTE — Op Note (Addendum)
[facility] Patient Name: Kendra Hutchinson Procedure Date: 11/16/2021 MRN: [id number] Attending MD: Kerin Salen , MD Date of Birth: 1975-10-19 CSN: [id number] Age: 46 Admit Type: Outpatient Procedure:                Colonoscopy Indications:              Screening for colorectal malignant neoplasm, This                            is the patient's first colonoscopy Providers:                Kerin Salen, MD, Fransisca Connors, Rozetta Nunnery,                            Technician Referring MD:             Geni Bers Medicines:                Monitored Anesthesia Care Complications:            No immediate complications. Estimated Blood Loss:     Estimated blood loss: none. Procedure:                Pre-Anesthesia Assessment:                           - Prior to the procedure, a History and Physical                            was performed, and patient medications and                            allergies were reviewed. The patient's tolerance of                            previous anesthesia was also reviewed. The risks                            and benefits of the procedure and the sedation                            options and risks were discussed with the patient.                            All questions were answered, and informed consent                            was obtained. Prior Anticoagulants: The patient has                            taken no previous anticoagulant or antiplatelet                            agents. ASA Grade Assessment: III - A patient with                            severe  systemic disease. After reviewing the risks                            and benefits, the patient was deemed in                            satisfactory condition to undergo the procedure.                           After obtaining informed consent, the colonoscope                            was passed under direct vision. Throughout the                             procedure, the patient's blood pressure, pulse, and                            oxygen saturations were monitored continuously. The                            CF-HQ190L ([id number]) Olympus colonoscope was                            introduced through the anus and advanced to the the                            terminal ileum. The colonoscopy was performed                            without difficulty. The patient tolerated the                            procedure well. The quality of the bowel                            preparation was good. Scope In: 8:18:10 AM Scope Out: 8:47:31 AM Scope Withdrawal Time: 0 hours 24 minutes 16 seconds  Total Procedure Duration: 0 hours 29 minutes 21 seconds  Findings:      The perianal and digital rectal examinations were normal.      A 9 mm polyp was found in the descending colon. The polyp was       semi-sessile. The polyp was removed with a hot snare. Resection and       retrieval were complete.      Scattered small and large-mouthed diverticula were found in the sigmoid       colon, descending colon, transverse colon and ascending colon.      The terminal ileum appeared normal.      Non-bleeding internal hemorrhoids were found during retroflexion. Impression:               - One 9 mm polyp in the descending colon, removed                            with a hot snare. Resected and  retrieved.                           - Diverticulosis in the sigmoid colon, in the                            descending colon, in the transverse colon and in                            the ascending colon.                           - The examined portion of the ileum was normal.                           - Non-bleeding internal hemorrhoids. Moderate Sedation:      Patient did not receive moderate sedation for this procedure, but       instead received monitored anesthesia care. Recommendation:           - Patient has a contact number available for                             emergencies. The signs and symptoms of potential                            delayed complications were discussed with the                            patient. Return to normal activities tomorrow.                            Written discharge instructions were provided to the                            patient.                           - High fiber diet.                           - Continue present medications.                           - Await pathology results.                           - Repeat colonoscopy in 5 years for surveillance                            based on pathology results. Procedure Code(s):        --- Professional ---                           [id number], Colonoscopy, flexible; with removal of  tumor(s), polyp(s), or other lesion(s) by snare                            technique Diagnosis Code(s):        --- Professional ---                           Z12.11, Encounter for screening for malignant                            neoplasm of colon                           K63.5, Polyp of colon                           K64.8, Other hemorrhoids                           K57.30, Diverticulosis of large intestine without                            perforation or abscess without bleeding CPT copyright 2019 American Medical Association. All rights reserved. The codes documented in this report are preliminary and upon coder review may  be revised to meet current compliance requirements. Kerin Salen, MD 11/16/2021 9:01:46 AM This report has been signed electronically. Number of Addenda: 0

## 2021-11-16 NOTE — Discharge Instructions (Signed)

## 2021-11-16 NOTE — Anesthesia Postprocedure Evaluation (Signed)
Anesthesia Post Note  Patient: Kendra Hutchinson  Procedure(s) Performed: COLONOSCOPY WITH PROPOFOL POLYPECTOMY     Patient location during evaluation: PACU Anesthesia Type: MAC Level of consciousness: awake and alert Pain management: pain level controlled Vital Signs Assessment: post-procedure vital signs reviewed and stable Respiratory status: spontaneous breathing, nonlabored ventilation and respiratory function stable Cardiovascular status: blood pressure returned to baseline Postop Assessment: no apparent nausea or vomiting Anesthetic complications: no   No notable events documented.  Last Vitals:  Vitals:   11/16/21 0901 11/16/21 0915  BP: (!) 117/52 (!) 166/97  Pulse: 88 91  Resp: (!) 25 20  Temp:    SpO2: 99% 100%    Last Pain:  Vitals:   11/16/21 0915  TempSrc:   PainSc: 0-No pain                 Marthenia Rolling

## 2021-11-17 ENCOUNTER — Encounter (HOSPITAL_COMMUNITY): Payer: Self-pay | Admitting: Gastroenterology

## 2021-11-18 LAB — SURGICAL PATHOLOGY

## 2022-09-22 ENCOUNTER — Other Ambulatory Visit: Payer: Self-pay | Admitting: Obstetrics and Gynecology

## 2022-09-22 DIAGNOSIS — Z1231 Encounter for screening mammogram for malignant neoplasm of breast: Secondary | ICD-10-CM

## 2022-10-07 ENCOUNTER — Ambulatory Visit
Admission: RE | Admit: 2022-10-07 | Discharge: 2022-10-07 | Disposition: A | Discharge status: Home / Self Care | Payer: Managed Care, Other (non HMO) | Source: Ambulatory Visit

## 2022-10-07 DIAGNOSIS — Z1231 Encounter for screening mammogram for malignant neoplasm of breast: Secondary | ICD-10-CM

## 2022-10-11 IMAGING — MG MM DIGITAL SCREENING BILAT W/ TOMO AND CAD
6 of 10 series · 6 of 30 positions shown · non-contrast
Comparison: Previous exam(s).

ACR Breast Density Category a: The breast tissue is almost entirely
fatty.

CLINICAL DATA: Screening.

EXAM:
DIGITAL SCREENING BILATERAL MAMMOGRAM WITH TOMOSYNTHESIS AND CAD
TECHNIQUE: Bilateral screening digital craniocaudal and mediolateral oblique
mammograms were obtained. Bilateral screening digital breast
tomosynthesis was performed. The images were evaluated with
computer-aided detection.

[R CC synth-2D (1 of 2)]
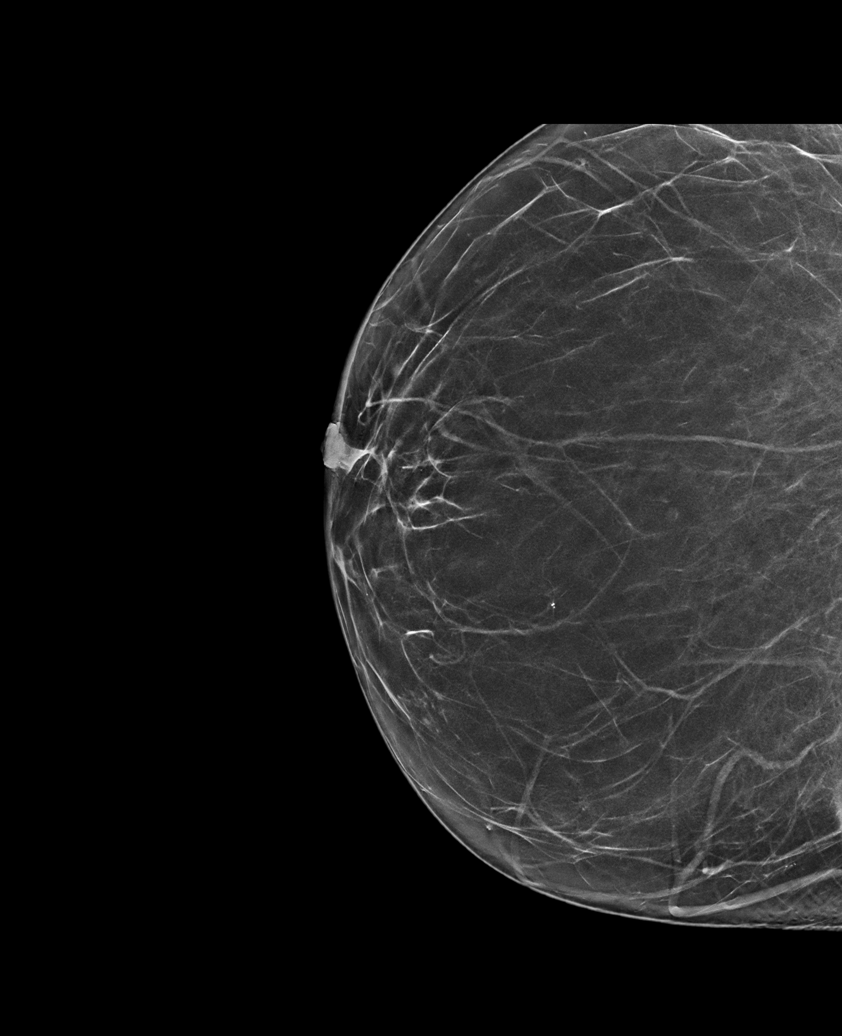

[L MLO synth-2D]
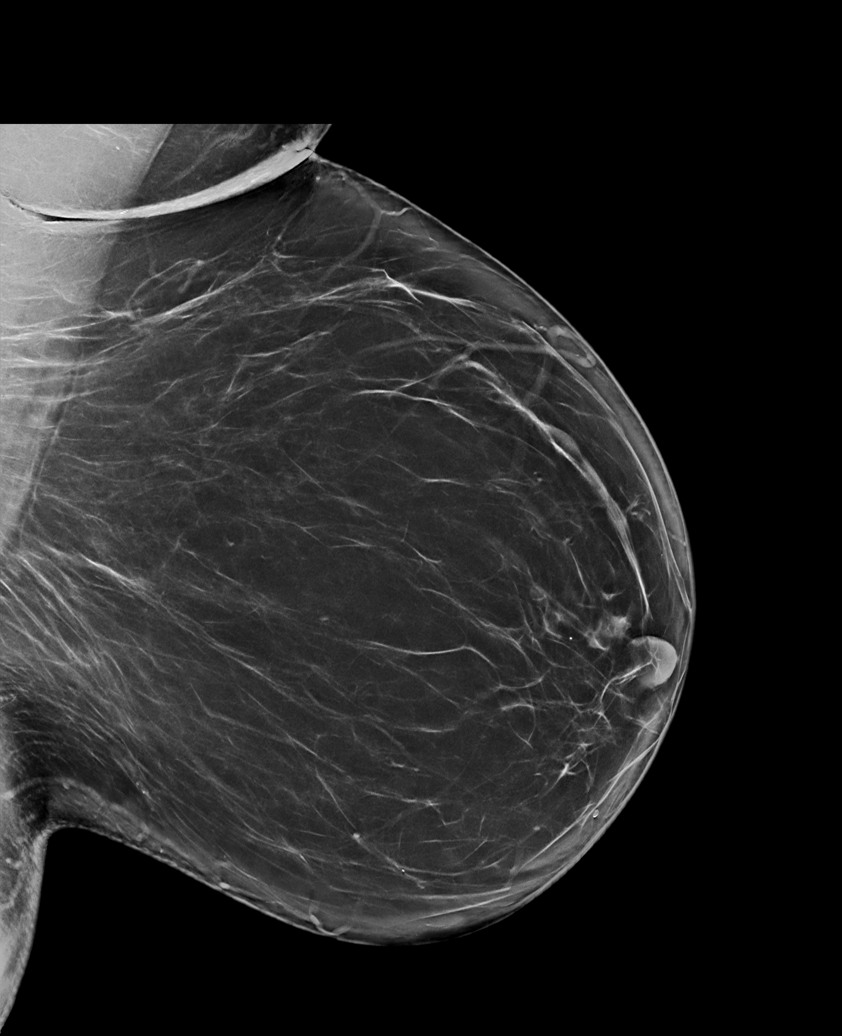

[R MLO synth-2D]
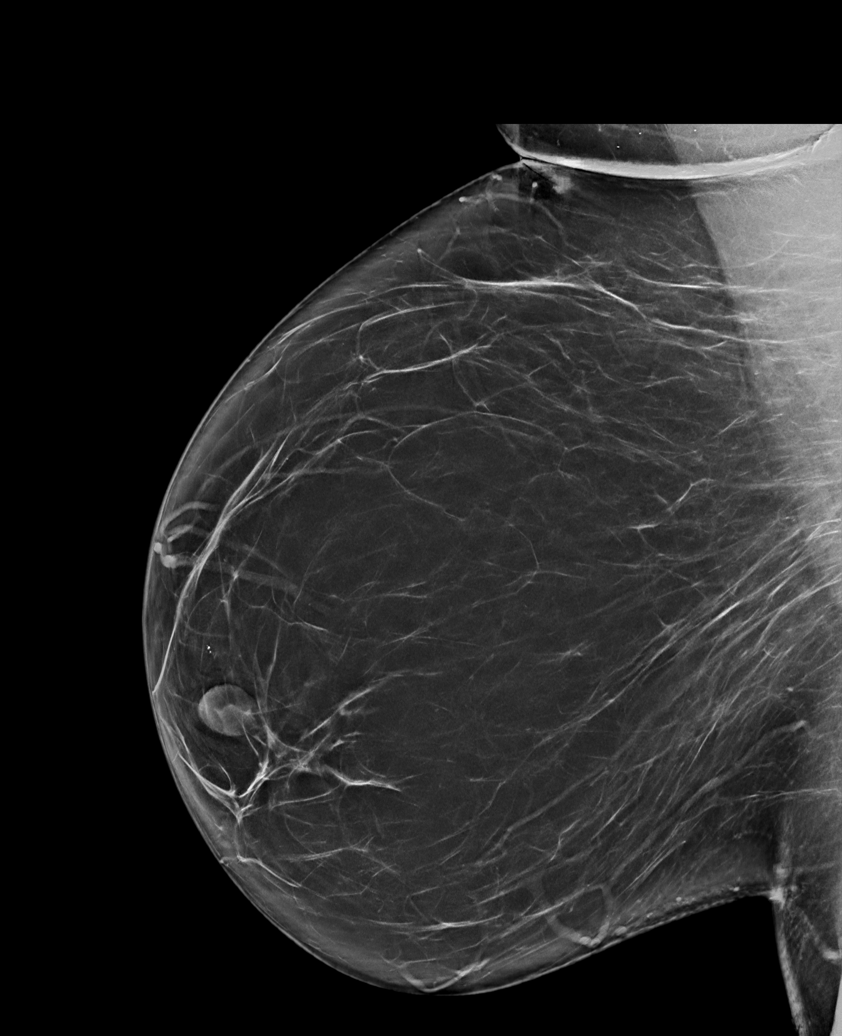

[R CC synth-2D (2 of 2)]
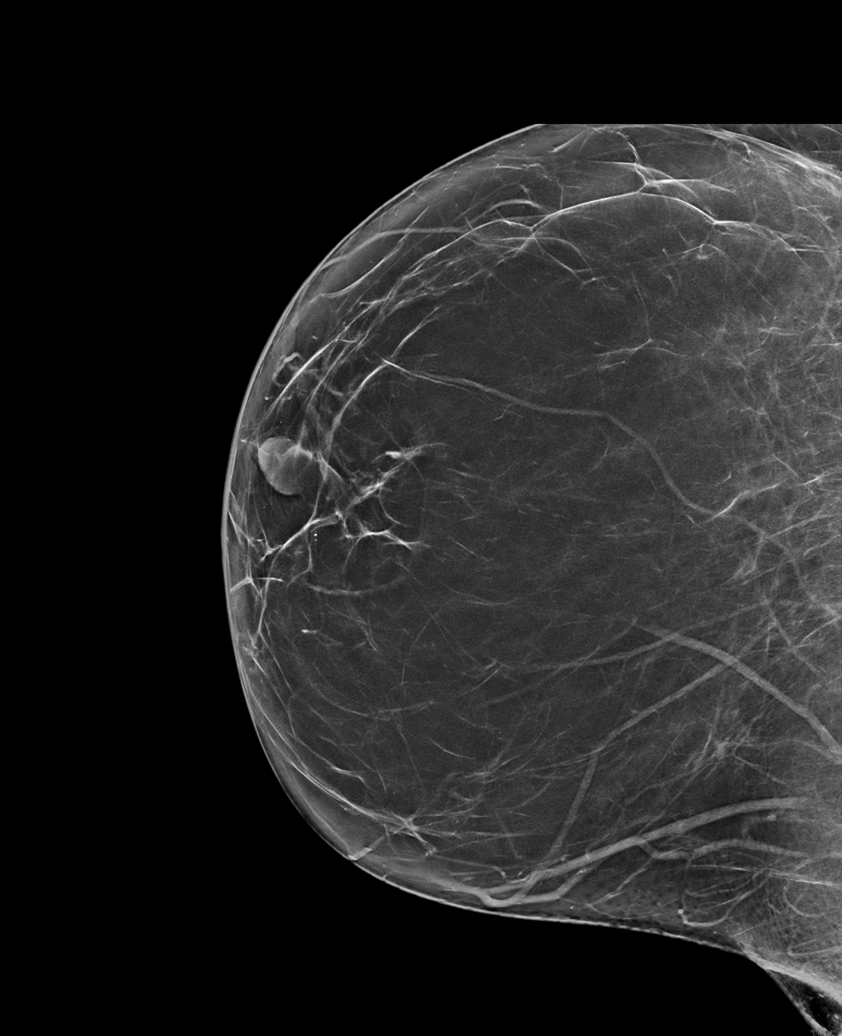

[L CC synth-2D]
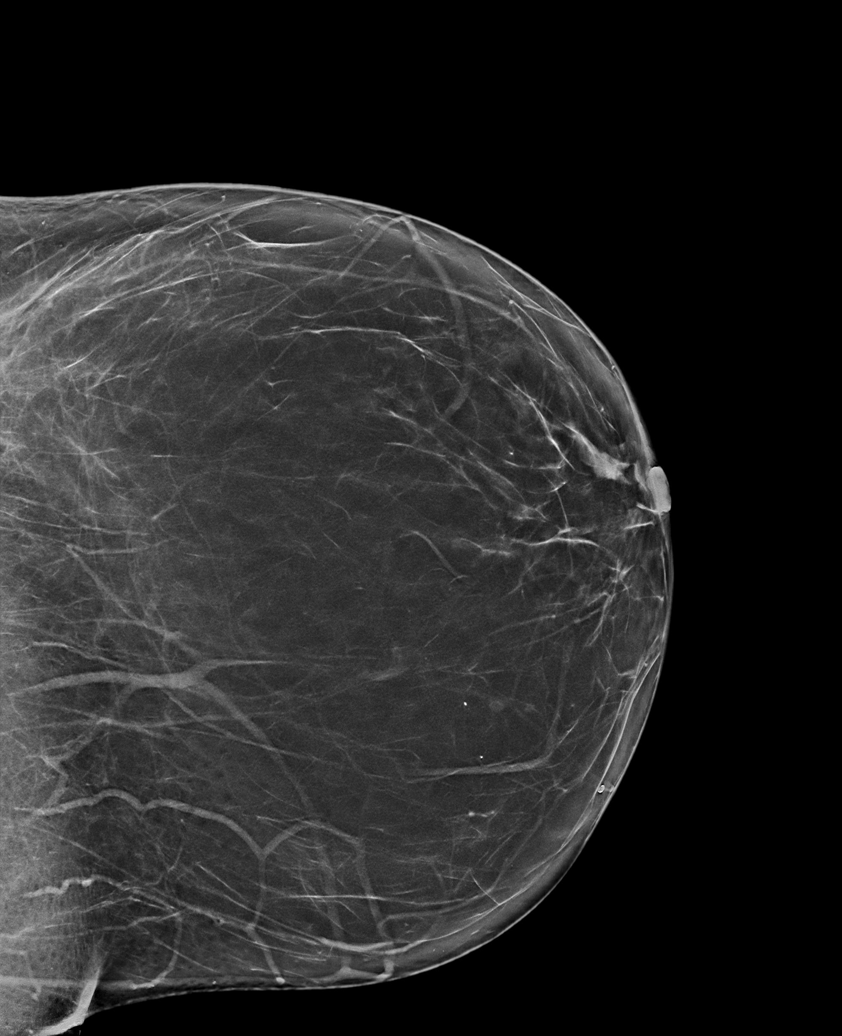

[R MLO tomo · tomo slice 46/91.0]
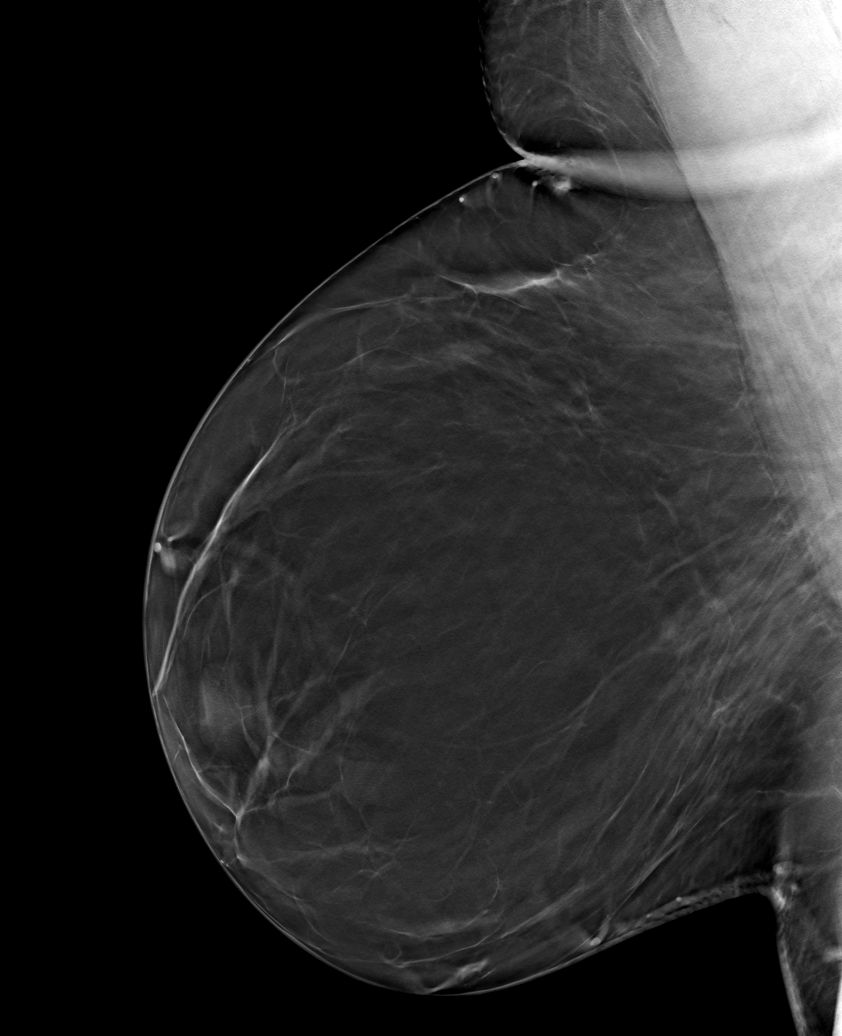

[6 of 30 positions shown; findings below may reference images not displayed]

FINDINGS: There are no findings suspicious for malignancy.
IMPRESSION: No mammographic evidence of malignancy. A result letter of this
screening mammogram will be mailed directly to the patient.

RECOMMENDATION:
Screening mammogram in one year. (Code:0E-3-N98)

BI-RADS CATEGORY  1: Negative.

## 2023-10-02 ENCOUNTER — Other Ambulatory Visit: Payer: Self-pay | Admitting: Internal Medicine

## 2023-10-02 DIAGNOSIS — Z1231 Encounter for screening mammogram for malignant neoplasm of breast: Secondary | ICD-10-CM

## 2023-10-10 ENCOUNTER — Ambulatory Visit
Admission: RE | Admit: 2023-10-10 | Discharge: 2023-10-10 | Disposition: A | Discharge status: Home / Self Care | Source: Ambulatory Visit

## 2023-10-10 DIAGNOSIS — Z1231 Encounter for screening mammogram for malignant neoplasm of breast: Secondary | ICD-10-CM
